# Patient Record
Sex: Female | Born: 1970 | Race: Black or African American | Hispanic: No | Marital: Married | Smoking: Never smoker
Health system: Southern US, Community
[De-identification: ages and names within clinical notes are randomized; demographics above are authoritative.]

## PROBLEM LIST (undated history)

## (undated) DIAGNOSIS — J45909 Unspecified asthma, uncomplicated: Secondary | ICD-10-CM

## (undated) DIAGNOSIS — N289 Disorder of kidney and ureter, unspecified: Secondary | ICD-10-CM

## (undated) DIAGNOSIS — D509 Iron deficiency anemia, unspecified: Secondary | ICD-10-CM

## (undated) DIAGNOSIS — E039 Hypothyroidism, unspecified: Secondary | ICD-10-CM

## (undated) DIAGNOSIS — F32A Depression, unspecified: Secondary | ICD-10-CM

## (undated) DIAGNOSIS — E119 Type 2 diabetes mellitus without complications: Secondary | ICD-10-CM

## (undated) DIAGNOSIS — F329 Major depressive disorder, single episode, unspecified: Secondary | ICD-10-CM

## (undated) DIAGNOSIS — R51 Headache: Secondary | ICD-10-CM

## (undated) HISTORY — DX: Type 2 diabetes mellitus without complications: E11.9

## (undated) HISTORY — PX: OTHER SURGICAL HISTORY: SHX169

## (undated) HISTORY — DX: Major depressive disorder, single episode, unspecified: F32.9

## (undated) HISTORY — DX: Unspecified asthma, uncomplicated: J45.909

## (undated) HISTORY — DX: Iron deficiency anemia, unspecified: D50.9

## (undated) HISTORY — DX: Headache: R51

## (undated) HISTORY — DX: Depression, unspecified: F32.A

## (undated) HISTORY — DX: Hypothyroidism, unspecified: E03.9

---

## 1999-11-23 ENCOUNTER — Encounter: Payer: Self-pay | Admitting: Emergency Medicine

## 1999-11-23 ENCOUNTER — Emergency Department (HOSPITAL_COMMUNITY): Admission: EM | Admit: 1999-11-23 | Discharge: 1999-11-23 | Payer: Self-pay | Admitting: Emergency Medicine

## 2003-06-06 HISTORY — PX: TUBAL LIGATION: SHX77

## 2004-07-05 ENCOUNTER — Emergency Department (HOSPITAL_COMMUNITY): Admission: EM | Admit: 2004-07-05 | Discharge: 2004-07-05 | Payer: Self-pay | Admitting: Emergency Medicine

## 2005-04-21 ENCOUNTER — Ambulatory Visit (HOSPITAL_COMMUNITY): Admission: RE | Admit: 2005-04-21 | Discharge: 2005-04-21 | Payer: Self-pay | Admitting: *Deleted

## 2009-11-07 ENCOUNTER — Emergency Department (HOSPITAL_COMMUNITY): Admission: EM | Admit: 2009-11-07 | Discharge: 2009-11-07 | Payer: Self-pay | Admitting: Emergency Medicine

## 2010-06-25 ENCOUNTER — Encounter: Payer: Self-pay | Admitting: *Deleted

## 2011-06-06 HISTORY — PX: BIOPSY THYROID: PRO38

## 2011-10-18 ENCOUNTER — Other Ambulatory Visit (HOSPITAL_COMMUNITY): Payer: Self-pay | Admitting: Endocrinology

## 2012-05-06 ENCOUNTER — Telehealth (HOSPITAL_COMMUNITY): Payer: Self-pay | Admitting: Dietician

## 2012-05-06 NOTE — Telephone Encounter (Signed)
Phone message reports "the number you have dialed is either no longer available or incorrect". Sent letter to pt home via [place] [employer] in attempt to contact pt to schedule appointment.

## 2012-05-06 NOTE — Telephone Encounter (Signed)
Received referral via fax from [facility] (Dr. Talmage Nap) for dx: obesity.

## 2012-05-13 NOTE — Telephone Encounter (Signed)
Pt did not respond to first contact attempt. Sent letter to pt home via [place] Mail in attempt to contact pt to schedule appointment.  

## 2012-05-16 NOTE — Telephone Encounter (Signed)
Pt has not responded to first two contact attempts. Final attempt. Sent letter to pt home via [place] [employer] in attempt to contact pt to schedule appointment.

## 2012-05-24 NOTE — Telephone Encounter (Signed)
Pt has not responded to attempts to contact to schedule appointment. Referral filed.  

## 2012-09-04 ENCOUNTER — Ambulatory Visit (INDEPENDENT_AMBULATORY_CARE_PROVIDER_SITE_OTHER): Payer: BC Managed Care – PPO | Admitting: Neurology

## 2012-09-04 ENCOUNTER — Encounter: Payer: Self-pay | Admitting: Neurology

## 2012-09-04 VITALS — BP 117/78 | HR 75 | Ht 67.75 in | Wt 210.0 lb

## 2012-09-04 DIAGNOSIS — G43019 Migraine without aura, intractable, without status migrainosus: Secondary | ICD-10-CM | POA: Insufficient documentation

## 2012-09-04 MED ORDER — NORTRIPTYLINE HCL 10 MG PO CAPS: 30.0000 mg | ORAL_CAPSULE | Freq: Every day | ORAL | Status: DC

## 2012-09-04 MED ORDER — KETOROLAC TROMETHAMINE 10 MG PO TABS: 10.0000 mg | ORAL_TABLET | Freq: Three times a day (TID) | ORAL | Status: DC | PRN

## 2012-09-04 NOTE — Patient Instructions (Signed)
  With the Nortriptyline, take as follows: One capsule at night for one week, then take 2 capsules at night for one week, then take three capsules at night    Migraine Headache A migraine headache is an intense, throbbing pain on one or both sides of your head. A migraine can last for 30 minutes to several hours. CAUSES  The exact cause of a migraine headache is not always known. However, a migraine may be caused when nerves in the brain become irritated and release chemicals that cause inflammation. This causes pain. SYMPTOMS  Pain on one or both sides of your head.  Pulsating or throbbing pain.  Severe pain that prevents daily activities.  Pain that is aggravated by any physical activity.  Nausea, vomiting, or both.  Dizziness.  Pain with exposure to bright lights, loud noises, or activity.  General sensitivity to bright lights, loud noises, or smells. Before you get a migraine, you may get warning signs that a migraine is coming (aura). An aura may include:  Seeing flashing lights.  Seeing bright spots, halos, or zig-zag lines.  Having tunnel vision or blurred vision.  Having feelings of numbness or tingling.  Having trouble talking.  Having muscle weakness. MIGRAINE TRIGGERS  Alcohol.  Smoking.  Stress.  Menstruation.  Aged cheeses.  Foods or drinks that contain nitrates, glutamate, aspartame, or tyramine.  Lack of sleep.  Chocolate.  Caffeine.  Hunger.  Physical exertion.  Fatigue.  Medicines used to treat chest pain (nitroglycerine), birth control pills, estrogen, and some blood pressure medicines. DIAGNOSIS  A migraine headache is often diagnosed based on:  Symptoms.  Physical examination.  A CT scan or MRI of your head. TREATMENT Medicines may be given for pain and nausea. Medicines can also be given to help prevent recurrent migraines.  HOME CARE INSTRUCTIONS  Only take over-the-counter or prescription medicines for pain or  discomfort as directed by your caregiver. The use of long-term narcotics is not recommended.  Lie down in a dark, quiet room when you have a migraine.  Keep a journal to find out what may trigger your migraine headaches. For example, write down:  What you eat and drink.  How much sleep you get.  Any change to your diet or medicines.  Limit alcohol consumption.  Quit smoking if you smoke.  Get 7 to 9 hours of sleep, or as recommended by your caregiver.  Limit stress.  Keep lights dim if bright lights bother you and make your migraines worse. SEEK IMMEDIATE MEDICAL CARE IF:   Your migraine becomes severe.  You have a fever.  You have a stiff neck.  You have vision loss.  You have muscular weakness or loss of muscle control.  You start losing your balance or have trouble walking.  You feel faint or pass out.  You have severe symptoms that are different from your first symptoms. MAKE SURE YOU:   Understand these instructions.  Will watch your condition.  Will get help right away if you are not doing well or get worse. Document Released: 05/22/2005 Document Revised: 08/14/2011 Document Reviewed: 05/12/2011 [facility] Patient Information 2013 [place], [place].

## 2012-09-04 NOTE — Progress Notes (Signed)
Reason for visit: Headache  Jessica [name] is a 42 y.o. female  History of present illness:  Jessica [name] is a 42 year old right-handed black female with a history of migraine headaches dating back many years. The patient was seen through this office in the distant past, but she has not been seen in over 4 years. The patient has been on a multitude of medications previously that include Topamax. The patient got minimal benefit with this medication, and she could not tolerate any of the Tryptan medications secondary to nausea and vomiting. The patient indicates that her usual headache frequency is approximately 2 times a week, and her usual migraines begin on the left occipital area of the head, and spread forward. The patient may have blurring of vision, nausea and vomiting, and some tingly sensations in the hands. The patient indicates that odors are an activator for her headache. The patient indicates that 2 months ago, she fell while going downstairs coming out of her house. The patient may have lost consciousness briefly. Since that time, the patient has had daily headaches that are now bifrontal in nature, associated with a throbbing sensation, and some nausea. The headaches are lasting all day long, unassociated with neck stiffness. The patient reports no new numbness or weakness of the face, arms, or legs. The patient feels dizzy at times, and she may be off balance, but she has not had any further falls. The patient denies problems controlling the bowels or the bladder. The patient has been taking Advil for the headache without full benefit. The patient did undergo MRI evaluation of the brain with MRA of the head that was unremarkable. The patient is sent to this office for an evaluation. The patient does report some photophobia and phonophobia with the headache. The patient has no significant scalp tenderness with the headache.  Past Medical History  Diagnosis Date  . Hypothyroidism   . Asthma   .  Headache   . Depression     Past Surgical History  Procedure Laterality Date  . Tubal ligation  2005  . Thyroid biospy    . Biopsy thyroid  2013  . Laroscopy      Family History  Problem Relation Age of Onset  . Hypertension Mother   . Migraines Mother   . Sickle cell anemia Father     Social history:  reports that she has never smoked. She does not have any smokeless tobacco history on file. She reports that she does not drink alcohol or use illicit drugs.  Medications:  No current outpatient prescriptions on file prior to visit.   No current facility-administered medications on file prior to visit.    Allergies:  Allergies  Allergen Reactions  . Hydrocodone   . Imitrex (Sumatriptan) Nausea And Vomiting    Intolerant to all Tryptan medications  . Sulfa Antibiotics     ROS:  Out of a complete 14 system review of symptoms, the patient complains only of the following symptoms, and all other reviewed systems are negative.  Weight gain, fatigue Swelling in the legs Skin rash Blurred vision Shortness of breath, cough, wheezing, history of asthma Allergies Memory loss Headache, dizziness Depression  Blood pressure 117/78, pulse 75, height 5' 7.75" (1.721 m), weight 210 lb (95.255 kg).  Physical Exam  General: The patient is alert and cooperative at the time of the examination.  Head: Pupils are equal, round, and reactive to light. Discs are flat bilaterally.  Neck: The neck is supple, no carotid  bruits are noted.  Respiratory: The respiratory examination is clear.  Cardiovascular: The cardiovascular examination reveals a regular rate and rhythm, no obvious murmurs or rubs are noted.  Neuromuscular: The patient has full range of movement of the cervical spine. There is no crepitus in the temporomandibular joints on either side. No significant tenderness with palpation of the frontal or maxillary sinuses is noted.  Skin: Extremities are without significant  edema.  Neurologic Exam  Mental status:  Cranial nerves: Facial symmetry is present. There is good sensation of the face to pinprick and soft touch bilaterally. The strength of the facial muscles and the muscles to head turning and shoulder shrug are normal bilaterally. Speech is well enunciated, no aphasia or dysarthria is noted. Extraocular movements are full. Visual fields are full.  Motor: The motor testing reveals 5 over 5 strength of all 4 extremities. Good symmetric motor tone is noted throughout.  Sensory: Sensory testing is intact to pinprick, soft touch, vibration sensation, and position sense on all 4 extremities. No evidence of extinction is noted.  Coordination: Cerebellar testing reveals good finger-nose-finger and heel-to-shin bilaterally.  Gait and station: Gait is normal. Tandem gait is normal. Romberg is negative. No drift is seen  Reflexes: Deep tendon reflexes are symmetric and normal bilaterally. Toes are downgoing bilaterally.   Assessment/Plan:  One. Intractable migraine  2. Trauma triggered migraine  The patient appears to have a history of migraine for many years, exacerbated by a fall with a blow to the head. MRI evaluation the brain has been unremarkable. The patient will be placed on low-dose nortriptyline, and she will be given ketorolac if needed for the headache. The patient will followup in 3 months. Usually, the trauma triggered migraine improves after 4-6 months, returning to the usual baseline frequency of headache.  Marlan Palau MD 09/04/2012 8:36 PM  [facility] [address] [place], [place] [address]  Phone [phone] Fax [phone]

## 2013-01-27 ENCOUNTER — Ambulatory Visit: Payer: BC Managed Care – PPO | Admitting: Neurology

## 2013-06-16 ENCOUNTER — Telehealth: Payer: Self-pay | Admitting: *Deleted

## 2013-06-16 NOTE — Telephone Encounter (Signed)
08/14/13 appointment needs to be rescheduled.

## 2013-08-14 ENCOUNTER — Ambulatory Visit: Payer: BC Managed Care – PPO | Admitting: Neurology

## 2013-12-02 ENCOUNTER — Encounter: Payer: Self-pay | Admitting: Neurology

## 2013-12-02 ENCOUNTER — Ambulatory Visit (INDEPENDENT_AMBULATORY_CARE_PROVIDER_SITE_OTHER): Payer: BC Managed Care – PPO | Admitting: Neurology

## 2013-12-02 ENCOUNTER — Encounter (INDEPENDENT_AMBULATORY_CARE_PROVIDER_SITE_OTHER): Payer: Self-pay

## 2013-12-02 VITALS — BP 118/72 | HR 76 | Wt 221.0 lb

## 2013-12-02 DIAGNOSIS — G43019 Migraine without aura, intractable, without status migrainosus: Secondary | ICD-10-CM

## 2013-12-02 DIAGNOSIS — R413 Other amnesia: Secondary | ICD-10-CM

## 2013-12-02 MED ORDER — NORTRIPTYLINE HCL 10 MG PO CAPS: ORAL_CAPSULE | ORAL | Status: DC

## 2013-12-02 MED ORDER — KETOROLAC TROMETHAMINE 10 MG PO TABS
10.0000 mg | ORAL_TABLET | Freq: Three times a day (TID) | ORAL | Status: DC | PRN
Start: 2013-12-02 — End: 2014-04-03

## 2013-12-02 NOTE — Progress Notes (Signed)
Reason for visit: Headache  Jessica [name] is an 43 y.o. female  History of present illness:  Jessica [name] is a 43 year old right-handed black female with a history of migraine headaches. The patient has not been seen since April 2014. She was placed on nortriptyline at that point for daily headaches, and she indicates that the headaches did improve. She was still having some headaches occurring once or twice a week, but the headaches were much easier to treat. The patient is using over-the-counter medications that she indicates that she has side effects from the Tryptan medications with severe nausea. Hydrocodone is not tolerated. In the past, she has taken Midrin without benefit. She ran out of her nortriptyline several months ago, and her headaches have once again worsened. The patient is having one headache every 2 days, and headaches may last up to 2 days. The patient is having some blurring of vision, and some nausea with the headache. She indicates that she is having some problems with concentration and memory as well. She has had MRI evaluation of the brain in the past that was unremarkable. She returns to this office for an evaluation.  Past Medical History  Diagnosis Date  . Hypothyroidism   . Asthma   . Headache(784.0)   . Depression   . Diabetes mellitus without complication   . Iron deficiency anemia     Past Surgical History  Procedure Laterality Date  . Tubal ligation  2005  . Thyroid biospy    . Biopsy thyroid  2013  . Laroscopy      Family History  Problem Relation Age of Onset  . Hypertension Mother   . Migraines Mother   . Sickle cell anemia Father     Social history:  reports that she has never smoked. She does not have any smokeless tobacco history on file. She reports that she does not drink alcohol or use illicit drugs.    Allergies  Allergen Reactions  . Hydrocodone   . Imitrex [Sumatriptan] Nausea And Vomiting    Intolerant to all Tryptan medications    . Sulfa Antibiotics     Medications:  Current Outpatient Prescriptions on File Prior to Visit  Medication Sig Dispense Refill  . Azelastine HCl (ASTEPRO) 0.15 % SOLN Place 1 spray into the nose daily.      . mometasone (ASMANEX) 220 MCG/INH inhaler Inhale 2 puffs into the lungs daily.       No current facility-administered medications on file prior to visit.    ROS:  Out of a complete 14 system review of symptoms, the patient complains only of the following symptoms, and all other reviewed systems are negative.  Light sensitivity, blurred vision Memory loss, headache  Blood pressure 118/72, pulse 76, weight 221 lb (100.245 kg).  Physical Exam  General: The patient is alert and cooperative at the time of the examination.  Skin: No significant peripheral edema is noted.   Neurologic Exam  Mental status: The patient is oriented x 3.  Cranial nerves: Facial symmetry is present. Speech is normal, no aphasia or dysarthria is noted. Extraocular movements are full. Visual fields are full.  Motor: The patient has good strength in all 4 extremities.  Sensory examination: Soft touch sensation is symmetric on the face, arms, and legs.  Coordination: The patient has good finger-nose-finger and heel-to-shin bilaterally.  Gait and station: The patient has a normal gait. Tandem gait is normal. Romberg is negative. No drift is seen.  Reflexes: Deep tendon  reflexes are symmetric.   Assessment/Plan:  1. Migraine headache  2. Reports of memory disturbance  The patient will be sent for some blood work today looking for etiologies of memory problems. This could be related to the migraine headache. She will be placed back on the nortriptyline, as this was helpful previously. She will be given ketorolac for a rescue drug for her headache. She will followup in 3-4 months.  Jill Alexanders MD 12/02/2013 9:39 PM  [facility] [address] [place],  [place] [address]  Phone [phone] Fax [phone]

## 2013-12-02 NOTE — Patient Instructions (Signed)

## 2013-12-04 LAB — RPR: SYPHILIS RPR SCR: NONREACTIVE

## 2013-12-04 LAB — VITAMIN B12: VITAMIN B 12: 300 pg/mL (ref 211–946)

## 2013-12-04 LAB — COPPER, SERUM: Copper: 170 ug/dL — ABNORMAL HIGH (ref 72–166)

## 2014-04-03 ENCOUNTER — Ambulatory Visit (INDEPENDENT_AMBULATORY_CARE_PROVIDER_SITE_OTHER): Payer: BC Managed Care – PPO | Admitting: Adult Health

## 2014-04-03 ENCOUNTER — Encounter: Payer: Self-pay | Admitting: Adult Health

## 2014-04-03 VITALS — BP 113/73 | HR 72 | Ht 68.5 in | Wt 226.0 lb

## 2014-04-03 DIAGNOSIS — G43909 Migraine, unspecified, not intractable, without status migrainosus: Secondary | ICD-10-CM | POA: Insufficient documentation

## 2014-04-03 DIAGNOSIS — G43009 Migraine without aura, not intractable, without status migrainosus: Secondary | ICD-10-CM

## 2014-04-03 MED ORDER — TOPIRAMATE 25 MG PO TABS: ORAL_TABLET | ORAL | Status: DC

## 2014-04-03 MED ORDER — KETOROLAC TROMETHAMINE 10 MG PO TABS: 10.0000 mg | ORAL_TABLET | Freq: Three times a day (TID) | ORAL | Status: DC | PRN

## 2014-04-03 NOTE — Progress Notes (Signed)
I have read the note, and I agree with the clinical assessment and plan.  Lisanne Ponce KEITH   

## 2014-04-03 NOTE — Patient Instructions (Signed)

## 2014-04-03 NOTE — Progress Notes (Signed)
PATIENT: Jessica [name] DOB: 02/27/1971  REASON FOR VISIT: follow up HISTORY FROM: patient  HISTORY OF PRESENT ILLNESS: Jessica [name] is a 43 year old female with a history of migraines. She returns today for followup. She was prescribed nortriptyline but developed a rash 1 month after taking the medication. Her PCP took her off the medication and her rash improved.  She reports that she has 2 headaches a month but she reports that they lasted for 2-3 days. + nausea denies vomiting. + photophobia but denies phonophobia. Afterwards her vision may be blurry but then it resolves. Her headaches are usually located bifrontal or bitemporal but has had the in the occipital area on the left side. She states that her headaches are usually 10/10. She states that cold packs helps ease her pain. Certain smells with trigger her headaches. Patient continues to feel that she is having problems with her memory. Blood work in the past have been unremarkable. She's had a sleep study per the patient and it showed mild sleep apnea.  HISTORY 12/02/13 (CW): Jessica [name] is a 43 year old right-handed black female with a history of migraine headaches. The patient has not been seen since April 2014. She was placed on nortriptyline at that point for daily headaches, and she indicates that the headaches did improve. She was still having some headaches occurring once or twice a week, but the headaches were much easier to treat. The patient is using over-the-counter medications that she indicates that she has side effects from the Tryptan medications with severe nausea. Hydrocodone is not tolerated. In the past, she has taken Midrin without benefit. She ran out of her nortriptyline several months ago, and her headaches have once again worsened. The patient is having one headache every 2 days, and headaches may last up to 2 days. The patient is having some blurring of vision, and some nausea with the headache. She indicates that she is having  some problems with concentration and memory as well. She has had MRI evaluation of the brain in the past that was unremarkable. She returns to this office for an evaluation.    REVIEW OF SYSTEMS: Full 14 system review of systems performed and notable only for:  Constitutional: N/A  Eyes: N/A Ear/Nose/Throat: N/A  Skin: N/A  Cardiovascular: N/A  Respiratory: N/A  Gastrointestinal: N/A  Genitourinary: N/A Hematology/Lymphatic: N/A  Endocrine: N/A Musculoskeletal:N/A  Allergy/Immunology: N/A  Neurological: N/A Psychiatric: N/A Sleep: N/A   ALLERGIES: Allergies  Allergen Reactions  . Hydrocodone   . Imitrex [Sumatriptan] Nausea And Vomiting    Intolerant to all Tryptan medications  . Sulfa Antibiotics     HOME MEDICATIONS: Outpatient Prescriptions Prior to Visit  Medication Sig Dispense Refill  . Azelastine HCl (ASTEPRO) 0.15 % SOLN Place 1 spray into the nose daily.      . Ferrous Sulfate Dried (SLOW RELEASE IRON) 45 MG TBCR Take 1 tablet by mouth daily.      Marland Kitchen ketorolac (TORADOL) 10 MG tablet Take 1 tablet (10 mg total) by mouth every 8 (eight) hours as needed.  30 tablet  1  . levothyroxine (SYNTHROID, LEVOTHROID) 112 MCG tablet Take 112 mcg by mouth daily before breakfast.      . metFORMIN (GLUCOPHAGE) 500 MG tablet Take 500 mg by mouth every evening.      . mometasone (ASMANEX) 220 MCG/INH inhaler Inhale 2 puffs into the lungs daily.      . nortriptyline (PAMELOR) 10 MG capsule Take one capsule at night  for one week, then take 2 capsules at night for one week, then take 3 capsules at night  90 capsule  5   No facility-administered medications prior to visit.    PAST MEDICAL HISTORY: Past Medical History  Diagnosis Date  . Hypothyroidism   . Asthma   . Headache(784.0)   . Depression   . Diabetes mellitus without complication   . Iron deficiency anemia     PAST SURGICAL HISTORY: Past Surgical History  Procedure Laterality Date  . Tubal ligation  2005  .  Thyroid biospy    . Biopsy thyroid  2013  . Laroscopy      FAMILY HISTORY: Family History  Problem Relation Age of Onset  . Hypertension Mother   . Migraines Mother   . Sickle cell anemia Father     SOCIAL HISTORY: History   Social History  . Marital Status: Married    Spouse Name: N/A    Number of Children: 3  . Years of Education: HS   Occupational History  . Not on file.   Social History Main Topics  . Smoking status: Never Smoker   . Smokeless tobacco: Not on file  . Alcohol Use: No  . Drug Use: No  . Sexual Activity: Not on file   Other Topics Concern  . Not on file   Social History Narrative  . No narrative on file      PHYSICAL EXAM  Filed Vitals:   04/03/14 1011  BP: 113/73  Pulse: 72  Height: 5' 8.5" (1.74 m)  Weight: 226 lb (102.513 kg)   Body mass index is 33.86 kg/(m^2).  Generalized: Well developed, in no acute distress  Head: normocephalic and atraumatic. Oropharynx benign  Neck: Supple, no carotid bruits  Cardiac: Regular rate rhythm, no murmur  Musculoskeletal: No deformity   Neurological examination  Mentation: Alert oriented to time, place, history taking. Follows all commands speech and language fluent. MOCA 24/30 Cranial nerve II-XII: Fundoscopic exam reveals sharp disc margins.Pupils were equal round reactive to light extraocular movements were full, visual field were full on confrontational test. Facial sensation and strength were normal. Uvula tongue midline. Head turning and shoulder shrug  were normal and symmetric.Tongue protrusion into cheek strength was normal. Motor: The motor testing reveals 5 over 5 strength of all 4 extremities. Good symmetric motor tone is noted throughout.  Sensory: Sensory testing is intact to pinprick, soft touch, vibration sensation, and position sense on all 4 extremities. No evidence of extinction is noted.  Coordination: Cerebellar testing reveals good finger-nose-finger and heel-to-shin  bilaterally.  Gait and station: Gait is normal. Tandem gait is normal. Romberg is negative. No drift is seen.  Reflexes: Deep tendon reflexes are symmetric and normal bilaterally. Toes are downgoing bilaterally.   DIAGNOSTIC DATA (LABS, IMAGING, TESTING) - I reviewed patient records, labs, notes, testing and imaging myself where available.      Lab Results  Component Value Date   VITAMINB12 300 12/02/2013   No results found for this basename: TSH      ASSESSMENT AND PLAN 43 y.o. year old female  has a past medical history of Hypothyroidism; Asthma; Headache(784.0); Depression; Diabetes mellitus without complication; and Iron deficiency anemia. here with;  1. Migraines  The patient was unable to tolerate the nortriptyline due to a rash. She has stopped this medication. I will start the patient on Topamax. She will take 25 mg at bedtime for 1 week then increase to 50 mg at bedtime thereafter. Patient continues  to feel that her memory is affected. Her MOCA was 24/30. We will continue to monitor her memory over time. If the patient's symptoms worsen or she develops new symptoms she should let us know. Otherwise she should followup in 4 months or sooner if needed.   Ward Givens, MSN, NP-C 04/03/2014, 10:09 AM [facility] [address] [place], [place] [address] [phone]  Note: This document was prepared with digital dictation and possible smart phrase technology. Any transcriptional errors that result from this process are unintentional.

## 2014-08-03 ENCOUNTER — Ambulatory Visit: Payer: BC Managed Care – PPO | Admitting: Adult Health

## 2014-08-05 ENCOUNTER — Encounter: Payer: Self-pay | Admitting: Adult Health

## 2015-12-30 ENCOUNTER — Encounter (HOSPITAL_COMMUNITY): Payer: Self-pay

## 2015-12-30 ENCOUNTER — Emergency Department (HOSPITAL_COMMUNITY)
Admission: EM | Admit: 2015-12-30 | Discharge: 2015-12-30 | Disposition: A | Discharge status: Home / Self Care | Payer: Self-pay | Attending: Emergency Medicine | Admitting: Emergency Medicine

## 2015-12-30 DIAGNOSIS — F329 Major depressive disorder, single episode, unspecified: Secondary | ICD-10-CM | POA: Insufficient documentation

## 2015-12-30 DIAGNOSIS — Z79899 Other long term (current) drug therapy: Secondary | ICD-10-CM | POA: Insufficient documentation

## 2015-12-30 DIAGNOSIS — T63411A Toxic effect of venom of centipedes and venomous millipedes, accidental (unintentional), initial encounter: Secondary | ICD-10-CM | POA: Insufficient documentation

## 2015-12-30 DIAGNOSIS — E039 Hypothyroidism, unspecified: Secondary | ICD-10-CM | POA: Insufficient documentation

## 2015-12-30 DIAGNOSIS — E119 Type 2 diabetes mellitus without complications: Secondary | ICD-10-CM | POA: Insufficient documentation

## 2015-12-30 DIAGNOSIS — T63443A Toxic effect of venom of bees, assault, initial encounter: Secondary | ICD-10-CM

## 2015-12-30 LAB — I-STAT CHEM 8, ED
BUN: 12 mg/dL (ref 6–20)
CALCIUM ION: 1.21 mmol/L (ref 1.13–1.30)
Chloride: 103 mmol/L (ref 101–111)
Creatinine, Ser: 1.2 mg/dL — ABNORMAL HIGH (ref 0.44–1.00)
Glucose, Bld: 96 mg/dL (ref 65–99)
HEMATOCRIT: 37 % (ref 36.0–46.0)
HEMOGLOBIN: 12.6 g/dL (ref 12.0–15.0)
Potassium: 3.8 mmol/L (ref 3.5–5.1)
SODIUM: 140 mmol/L (ref 135–145)
TCO2: 27 mmol/L (ref 0–100)

## 2015-12-30 MED ORDER — PREDNISONE 20 MG PO TABS: ORAL_TABLET | ORAL | 0 refills | Status: DC

## 2015-12-30 MED ORDER — DIPHENHYDRAMINE HCL 50 MG/ML IJ SOLN
25.0000 mg | Freq: Once | INTRAMUSCULAR | Status: AC
  Administered 2015-12-30: 25 mg via INTRAVENOUS
  Filled 2015-12-30: qty 1

## 2015-12-30 MED ORDER — FAMOTIDINE IN NACL 20-0.9 MG/50ML-% IV SOLN
20.0000 mg | Freq: Once | INTRAVENOUS | Status: AC
  Administered 2015-12-30: 20 mg via INTRAVENOUS
  Filled 2015-12-30: qty 50

## 2015-12-30 MED ORDER — EPINEPHRINE 0.3 MG/0.3ML IJ SOAJ
0.3000 mg | Freq: Once | INTRAMUSCULAR | 1 refills | Status: AC
Start: 2015-12-30 — End: 2015-12-30

## 2015-12-30 MED ORDER — EPINEPHRINE 0.3 MG/0.3ML IJ SOAJ
0.3000 mg | Freq: Once | INTRAMUSCULAR | Status: AC
  Administered 2015-12-30: 0.3 mg via INTRAMUSCULAR
  Filled 2015-12-30: qty 0.3

## 2015-12-30 MED ORDER — SODIUM CHLORIDE 0.9 % IV BOLUS (SEPSIS)
1000.0000 mL | Freq: Once | INTRAVENOUS | Status: AC
  Administered 2015-12-30: 1000 mL via INTRAVENOUS
  Filled 2015-12-30: qty 1000

## 2015-12-30 MED ORDER — METHYLPREDNISOLONE SODIUM SUCC 125 MG IJ SOLR
125.0000 mg | Freq: Once | INTRAMUSCULAR | Status: AC
  Administered 2015-12-30: 125 mg via INTRAVENOUS
  Filled 2015-12-30: qty 2

## 2015-12-30 MED ORDER — FAMOTIDINE 20 MG PO TABS: 20.0000 mg | ORAL_TABLET | Freq: Two times a day (BID) | ORAL | 0 refills | Status: DC

## 2015-12-30 NOTE — ED Notes (Signed)
Gave patient ice pack as requested to place on lower lip.

## 2015-12-30 NOTE — Discharge Instructions (Signed)
Take Benadryl 25 mg every 4-6 hours for swelling or itching. Follow-up with her family doctor next week if not improving. Return if getting worse

## 2015-12-30 NOTE — ED Triage Notes (Signed)
Patient was stung by bee on lower lip. Has lowe lip swelling and states her throat feels tight. Reports feeling a little short of breath.

## 2015-12-30 NOTE — ED Notes (Signed)
Dr. Roderic Palau made aware of patient.

## 2015-12-30 NOTE — ED Notes (Signed)
Dr Zammit at bedside. 
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
## 2016-05-12 ENCOUNTER — Emergency Department (HOSPITAL_COMMUNITY)
Admission: EM | Admit: 2016-05-12 | Discharge: 2016-05-12 | Disposition: A | Discharge status: Home / Self Care | Payer: Self-pay | Attending: Emergency Medicine | Admitting: Emergency Medicine

## 2016-05-12 ENCOUNTER — Encounter (HOSPITAL_COMMUNITY): Payer: Self-pay

## 2016-05-12 DIAGNOSIS — R0602 Shortness of breath: Secondary | ICD-10-CM | POA: Insufficient documentation

## 2016-05-12 DIAGNOSIS — E039 Hypothyroidism, unspecified: Secondary | ICD-10-CM | POA: Insufficient documentation

## 2016-05-12 DIAGNOSIS — E119 Type 2 diabetes mellitus without complications: Secondary | ICD-10-CM | POA: Insufficient documentation

## 2016-05-12 DIAGNOSIS — Z79899 Other long term (current) drug therapy: Secondary | ICD-10-CM | POA: Insufficient documentation

## 2016-05-12 DIAGNOSIS — J705 Respiratory conditions due to smoke inhalation: Secondary | ICD-10-CM | POA: Insufficient documentation

## 2016-05-12 DIAGNOSIS — R51 Headache: Secondary | ICD-10-CM | POA: Insufficient documentation

## 2016-05-12 DIAGNOSIS — Y92009 Unspecified place in unspecified non-institutional (private) residence as the place of occurrence of the external cause: Secondary | ICD-10-CM | POA: Insufficient documentation

## 2016-05-12 DIAGNOSIS — Y999 Unspecified external cause status: Secondary | ICD-10-CM | POA: Insufficient documentation

## 2016-05-12 DIAGNOSIS — Y939 Activity, unspecified: Secondary | ICD-10-CM | POA: Insufficient documentation

## 2016-05-12 DIAGNOSIS — X088XXA Exposure to other specified smoke, fire and flames, initial encounter: Secondary | ICD-10-CM | POA: Insufficient documentation

## 2016-05-12 DIAGNOSIS — T59811A Toxic effect of smoke, accidental (unintentional), initial encounter: Secondary | ICD-10-CM

## 2016-05-12 DIAGNOSIS — J45909 Unspecified asthma, uncomplicated: Secondary | ICD-10-CM | POA: Insufficient documentation

## 2016-05-12 MED ORDER — ALBUTEROL SULFATE (2.5 MG/3ML) 0.083% IN NEBU
5.0000 mg | INHALATION_SOLUTION | Freq: Once | RESPIRATORY_TRACT | Status: AC
  Administered 2016-05-12: 5 mg via RESPIRATORY_TRACT
  Filled 2016-05-12: qty 6

## 2016-05-12 MED ORDER — ACETAMINOPHEN 325 MG PO TABS
650.0000 mg | ORAL_TABLET | Freq: Once | ORAL | Status: AC
  Administered 2016-05-12: 650 mg via ORAL
  Filled 2016-05-12: qty 2
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
## 2016-05-12 NOTE — Progress Notes (Signed)
Unable to place lab values of COOX in [place]. RN made aware and notified of this and the results. The results are as follows   ctHb- 12.0  S02-95.7  FO2HB-93.9  FCOHb- 1.2  FMetHb-0.8  ctO2C- 16.0

## 2017-09-01 LAB — GLUCOSE, POCT (MANUAL RESULT ENTRY): POC Glucose: 120 mg/dl — AB (ref 70–99)

## 2017-09-01 LAB — POCT GLYCOSYLATED HEMOGLOBIN (HGB A1C)

## 2018-03-11 ENCOUNTER — Other Ambulatory Visit (HOSPITAL_COMMUNITY): Payer: Self-pay | Admitting: Nephrology

## 2018-03-11 DIAGNOSIS — N183 Chronic kidney disease, stage 3 unspecified: Secondary | ICD-10-CM

## 2018-03-15 ENCOUNTER — Ambulatory Visit (HOSPITAL_COMMUNITY)
Admission: RE | Admit: 2018-03-15 | Discharge: 2018-03-15 | Disposition: A | Discharge status: Home / Self Care | Payer: BLUE CROSS/BLUE SHIELD | Source: Ambulatory Visit | Attending: Nephrology | Admitting: Nephrology

## 2018-03-15 DIAGNOSIS — N183 Chronic kidney disease, stage 3 unspecified: Secondary | ICD-10-CM

## 2018-08-01 ENCOUNTER — Ambulatory Visit (INDEPENDENT_AMBULATORY_CARE_PROVIDER_SITE_OTHER): Payer: BLUE CROSS/BLUE SHIELD | Admitting: Otolaryngology

## 2018-08-01 DIAGNOSIS — J343 Hypertrophy of nasal turbinates: Secondary | ICD-10-CM

## 2018-08-01 DIAGNOSIS — J310 Chronic rhinitis: Secondary | ICD-10-CM

## 2018-08-01 DIAGNOSIS — R430 Anosmia: Secondary | ICD-10-CM

## 2018-08-04 ENCOUNTER — Other Ambulatory Visit: Payer: Self-pay

## 2018-08-04 ENCOUNTER — Emergency Department (HOSPITAL_COMMUNITY)
Admission: EM | Admit: 2018-08-04 | Discharge: 2018-08-04 | Disposition: A | Discharge status: Home / Self Care | Payer: BLUE CROSS/BLUE SHIELD | Attending: Emergency Medicine | Admitting: Emergency Medicine

## 2018-08-04 ENCOUNTER — Encounter (HOSPITAL_COMMUNITY): Payer: Self-pay | Admitting: Emergency Medicine

## 2018-08-04 ENCOUNTER — Emergency Department (HOSPITAL_COMMUNITY): Payer: BLUE CROSS/BLUE SHIELD

## 2018-08-04 DIAGNOSIS — E119 Type 2 diabetes mellitus without complications: Secondary | ICD-10-CM | POA: Insufficient documentation

## 2018-08-04 DIAGNOSIS — M7918 Myalgia, other site: Secondary | ICD-10-CM | POA: Diagnosis present

## 2018-08-04 DIAGNOSIS — Z79899 Other long term (current) drug therapy: Secondary | ICD-10-CM | POA: Diagnosis not present

## 2018-08-04 DIAGNOSIS — R05 Cough: Secondary | ICD-10-CM | POA: Insufficient documentation

## 2018-08-04 DIAGNOSIS — J111 Influenza due to unidentified influenza virus with other respiratory manifestations: Secondary | ICD-10-CM | POA: Diagnosis not present

## 2018-08-04 DIAGNOSIS — J45909 Unspecified asthma, uncomplicated: Secondary | ICD-10-CM | POA: Insufficient documentation

## 2018-08-04 DIAGNOSIS — E039 Hypothyroidism, unspecified: Secondary | ICD-10-CM | POA: Insufficient documentation

## 2018-08-04 HISTORY — DX: Disorder of kidney and ureter, unspecified: N28.9

## 2018-08-04 LAB — INFLUENZA PANEL BY PCR (TYPE A & B)
INFLBPCR: NEGATIVE
Influenza A By PCR: POSITIVE — AB

## 2018-08-04 MED ORDER — OSELTAMIVIR PHOSPHATE 75 MG PO CAPS: 75.0000 mg | ORAL_CAPSULE | Freq: Two times a day (BID) | ORAL | 0 refills | Status: DC

## 2018-08-04 MED ORDER — ONDANSETRON 4 MG PO TBDP
ORAL_TABLET | ORAL | Status: AC
  Filled 2018-08-04: qty 1

## 2018-08-04 MED ORDER — ACETAMINOPHEN 500 MG PO TABS
ORAL_TABLET | ORAL | Status: AC
  Filled 2018-08-04: qty 2

## 2018-08-04 MED ORDER — BENZONATATE 100 MG PO CAPS: 200.0000 mg | ORAL_CAPSULE | Freq: Three times a day (TID) | ORAL | 0 refills | Status: DC | PRN

## 2018-08-04 MED ORDER — OSELTAMIVIR PHOSPHATE 75 MG PO CAPS
75.0000 mg | ORAL_CAPSULE | Freq: Once | ORAL | Status: AC
  Administered 2018-08-04: 75 mg via ORAL
  Filled 2018-08-04: qty 1

## 2018-08-04 MED ORDER — ACETAMINOPHEN 500 MG PO TABS
1000.0000 mg | ORAL_TABLET | Freq: Once | ORAL | Status: AC
  Administered 2018-08-04: 1000 mg via ORAL

## 2018-08-04 MED ORDER — BENZONATATE 100 MG PO CAPS
200.0000 mg | ORAL_CAPSULE | Freq: Once | ORAL | Status: AC
  Administered 2018-08-04: 200 mg via ORAL
  Filled 2018-08-04: qty 2

## 2018-08-04 MED ORDER — ONDANSETRON 4 MG PO TBDP
4.0000 mg | ORAL_TABLET | Freq: Once | ORAL | Status: AC
  Administered 2018-08-04: 4 mg via ORAL

## 2018-08-04 NOTE — Discharge Instructions (Addendum)
Rest,  Drink plenty of fluids.  Take motrin or tylenol for achiness and fever reduction.  You may take the tamiflu if you desire.  This medicine may improve your flu symptoms 1-2 days sooner.  Get rechecked for increased shortness of breath,  Increased fever or increasing weakness.  

## 2018-08-04 NOTE — ED Triage Notes (Signed)
Pt c/o of nausea, cough, body aches, chill and fever

## 2018-08-05 NOTE — ED Provider Notes (Signed)
[facility] EMERGENCY DEPARTMENT Provider Note   CSN: [id number] Arrival date & time: 08/04/18  1726    History   Chief Complaint Chief Complaint  Patient presents with  . Influenza    HPI Jessica [name] is a 48 y.o. female with a history of asthma, renal insufficiency and diet controlled DM presenting with a  1 day history of flu like symptoms, reporting development of generalized body aches, dry cough with chills and subjective fever which started last night.  She reports increased fatigue and has clear nasal drainage without congestion, denies sore throat, headache, neck pain or stiffness and also denies shortness of breath or wheezing, also no cp, abdominal pain,vomiting or diarrhea but does endorse nausea with decreased po intake today.  She has had tylenol for fever reduction, last dose this am.    The history is provided by the patient.    Past Medical History:  Diagnosis Date  . Asthma   . Depression   . Diabetes mellitus without complication ([place])   . Headache(784.0)   . Hypothyroidism   . Iron deficiency anemia   . Renal disorder     Patient Active Problem List   Diagnosis Date Noted  . Migraine 04/03/2014  . Intractable migraine without aura 09/04/2012    Past Surgical History:  Procedure Laterality Date  . BIOPSY THYROID  2013  . laroscopy    . thyroid biospy    . TUBAL LIGATION  2005     OB History   No obstetric history on file.      Home Medications    Prior to Admission medications   Medication Sig Start Date End Date Taking? Authorizing Provider  benzonatate (TESSALON) 100 MG capsule Take 2 capsules (200 mg total) by mouth 3 (three) times daily as needed. 08/04/18   Evalee Jefferson, PA-C  famotidine (PEPCID) 20 MG tablet Take 1 tablet (20 mg total) by mouth 2 (two) times daily. 12/30/15   Milton Ferguson, MD  ketorolac (TORADOL) 10 MG tablet Take 1 tablet (10 mg total) by mouth every 8 (eight) hours as needed. Patient not taking: Reported on 12/30/2015  04/03/14   Ward Givens, NP  levothyroxine (SYNTHROID, LEVOTHROID) 112 MCG tablet Take 112 mcg by mouth daily before breakfast.    [provider]  oseltamivir (TAMIFLU) 75 MG capsule Take 1 capsule (75 mg total) by mouth every 12 (twelve) hours. 08/04/18   Evalee Jefferson, PA-C  predniSONE (DELTASONE) 20 MG tablet 2 tabs po daily x 3 days 12/30/15   Milton Ferguson, MD  topiramate (TOPAMAX) 25 MG tablet Take one tablet PO at HS for 1 week then increase to 2 tablets PO at HS thereafter. Patient not taking: Reported on 12/30/2015 04/03/14   Ward Givens, NP    Family History Family History  Problem Relation Age of Onset  . Hypertension Mother   . Migraines Mother   . Sickle cell anemia Father     Social History Social History   Tobacco Use  . Smoking status: Never Smoker  . Smokeless tobacco: Never Used  Substance Use Topics  . Alcohol use: No  . Drug use: No     Allergies   Bee venom; Hydrocodone; Imitrex [sumatriptan]; and Sulfa antibiotics   Review of Systems Review of Systems  Constitutional: Positive for chills and fever.  HENT: Positive for rhinorrhea. Negative for congestion and sore throat.   Eyes: Negative.   Respiratory: Positive for cough. Negative for chest tightness, shortness of breath and wheezing.  Cardiovascular: Negative for chest pain.  Gastrointestinal: Positive for nausea. Negative for abdominal pain, diarrhea and vomiting.  Genitourinary: Negative.  Negative for dysuria.  Musculoskeletal: Positive for myalgias. Negative for arthralgias, joint swelling and neck pain.  Skin: Negative.  Negative for rash and wound.  Neurological: Negative for dizziness, weakness, light-headedness, numbness and headaches.  Psychiatric/Behavioral: Negative.      Physical Exam Updated Vital Signs BP 121/76 (BP Location: Right Arm)   Pulse 90   Temp 97.9 F (36.6 C) (Oral)   Resp 18   Ht 5\' 7"  (1.702 m)   Wt 107 kg   SpO2 95%   BMI 36.96 kg/m    Physical Exam Constitutional:      Appearance: She is well-developed.  HENT:     Head: Normocephalic and atraumatic.     Right Ear: Tympanic membrane and ear canal normal.     Left Ear: Tympanic membrane and ear canal normal.     Nose: Rhinorrhea present. No mucosal edema.     Mouth/Throat:     Mouth: Mucous membranes are moist.     Pharynx: Oropharynx is clear. Uvula midline. No oropharyngeal exudate or posterior oropharyngeal erythema.     Tonsils: No tonsillar exudate or tonsillar abscesses.  Eyes:     Conjunctiva/sclera: Conjunctivae normal.  Neck:     Musculoskeletal: Full passive range of motion without pain.  Cardiovascular:     Rate and Rhythm: Normal rate.     Pulses: Normal pulses.     Heart sounds: Normal heart sounds.  Pulmonary:     Effort: Pulmonary effort is normal. No accessory muscle usage, respiratory distress or retractions.     Breath sounds: Examination of the right-lower field reveals rhonchi. Rhonchi present. No wheezing or rales.     Comments: Rhonchi right base, clears with cough. Abdominal:     Palpations: Abdomen is soft.     Tenderness: There is no abdominal tenderness.  Musculoskeletal: Normal range of motion.  Skin:    General: Skin is warm and dry.     Findings: No rash.  Neurological:     Mental Status: She is alert and oriented to person, place, and time.      ED Treatments / Results  Labs (all labs ordered are listed, but only abnormal results are displayed) Labs Reviewed  INFLUENZA PANEL BY PCR (TYPE A & B) - Abnormal; Notable for the following components:      Result Value   Influenza A By PCR POSITIVE (*)    All other components within normal limits    EKG None  Radiology Dg Chest 2 View  Result Date: 08/04/2018 CLINICAL DATA:  48 y/o  F; nausea, cough, body aches, chills, fever. EXAM: CHEST - 2 VIEW COMPARISON:  01/07/2018 chest radiograph FINDINGS: Stable heart size and mediastinal contours are within normal limits. Both  lungs are clear. The visualized skeletal structures are unremarkable. IMPRESSION: No acute pulmonary process identified. Electronically Signed   By: Kristine Garbe M.D.   On: 08/04/2018 20:22    Procedures Procedures (including critical care time)  Medications Ordered in ED Medications  acetaminophen (TYLENOL) tablet 1,000 mg (1,000 mg Oral Given 08/04/18 1802)  ondansetron (ZOFRAN-ODT) disintegrating tablet 4 mg (4 mg Oral Given 08/04/18 1802)  oseltamivir (TAMIFLU) capsule 75 mg (75 mg Oral Given 08/04/18 1948)  benzonatate (TESSALON) capsule 200 mg (200 mg Oral Given 08/04/18 2102)     Initial Impression / Assessment and Plan / ED Course  I have reviewed the triage  vital signs and the nursing notes.  Pertinent labs & imaging results that were available during my care of the patient were reviewed by me and considered in my medical decision making (see chart for details).        Pt with acute influenza, no respiratory distress, fever reduced with tylenol. VS stable.  Pt advised rest, fever tx, increased fluids, tamiflu started.  Strict return precautions outlined.   The patient appears reasonably screened and/or stabilized for discharge and I doubt any other medical condition or other [facility] requiring further screening, evaluation, or treatment in the ED at this time prior to discharge.   Final Clinical Impressions(s) / ED Diagnoses   Final diagnoses:  Influenza    ED Discharge Orders         Ordered    oseltamivir (TAMIFLU) 75 MG capsule  Every 12 hours,   Status:  Discontinued     08/04/18 2057    benzonatate (TESSALON) 100 MG capsule  3 times daily PRN,   Status:  Discontinued     08/04/18 2057    benzonatate (TESSALON) 100 MG capsule  3 times daily PRN     08/04/18 2107    oseltamivir (TAMIFLU) 75 MG capsule  Every 12 hours     08/04/18 2107           Evalee Jefferson, PA-C 08/06/18 2023    Dorie Rank, MD 08/08/18 [phone]

## 2018-08-22 ENCOUNTER — Ambulatory Visit (INDEPENDENT_AMBULATORY_CARE_PROVIDER_SITE_OTHER): Payer: BLUE CROSS/BLUE SHIELD | Admitting: Otolaryngology

## 2019-08-07 ENCOUNTER — Emergency Department (HOSPITAL_COMMUNITY)
Admission: EM | Admit: 2019-08-07 | Discharge: 2019-08-07 | Disposition: A | Discharge status: Home / Self Care | Payer: Managed Care, Other (non HMO) | Attending: Emergency Medicine | Admitting: Emergency Medicine

## 2019-08-07 ENCOUNTER — Encounter (HOSPITAL_COMMUNITY): Payer: Self-pay

## 2019-08-07 DIAGNOSIS — E119 Type 2 diabetes mellitus without complications: Secondary | ICD-10-CM | POA: Insufficient documentation

## 2019-08-07 DIAGNOSIS — J45909 Unspecified asthma, uncomplicated: Secondary | ICD-10-CM | POA: Diagnosis not present

## 2019-08-07 DIAGNOSIS — E039 Hypothyroidism, unspecified: Secondary | ICD-10-CM | POA: Insufficient documentation

## 2019-08-07 DIAGNOSIS — Z79899 Other long term (current) drug therapy: Secondary | ICD-10-CM | POA: Insufficient documentation

## 2019-08-07 DIAGNOSIS — R0602 Shortness of breath: Secondary | ICD-10-CM | POA: Insufficient documentation

## 2019-08-07 DIAGNOSIS — T65891A Toxic effect of other specified substances, accidental (unintentional), initial encounter: Secondary | ICD-10-CM | POA: Diagnosis not present

## 2019-08-07 MED ORDER — ALBUTEROL SULFATE HFA 108 (90 BASE) MCG/ACT IN AERS
6.0000 | INHALATION_SPRAY | Freq: Once | RESPIRATORY_TRACT | Status: AC
  Administered 2019-08-07: 6 via RESPIRATORY_TRACT
  Filled 2019-08-07: qty 6.7

## 2019-08-07 MED ORDER — ALBUTEROL SULFATE (2.5 MG/3ML) 0.083% IN NEBU: 2.5000 mg | INHALATION_SOLUTION | Freq: Four times a day (QID) | RESPIRATORY_TRACT | 12 refills | Status: DC | PRN

## 2019-08-07 MED ORDER — ALBUTEROL SULFATE HFA 108 (90 BASE) MCG/ACT IN AERS: 1.0000 | INHALATION_SPRAY | Freq: Four times a day (QID) | RESPIRATORY_TRACT | 2 refills | Status: DC | PRN
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
## 2019-08-07 NOTE — Discharge Instructions (Addendum)
I sent prescriptions for your nebulizer medicine and a new inhaler to your pharmacy.  Please use the spacer any time you're using the inhaler.

## 2019-08-07 NOTE — ED Notes (Signed)
Patient denies pain and is resting comfortably.  

## 2020-04-01 ENCOUNTER — Other Ambulatory Visit: Payer: Managed Care, Other (non HMO)

## 2020-04-01 ENCOUNTER — Other Ambulatory Visit: Payer: Self-pay | Admitting: *Deleted

## 2020-04-01 DIAGNOSIS — Z20822 Contact with and (suspected) exposure to covid-19: Secondary | ICD-10-CM

## 2020-04-02 LAB — SPECIMEN STATUS REPORT

## 2020-04-02 LAB — SARS-COV-2, NAA 2 DAY TAT

## 2020-04-02 LAB — NOVEL CORONAVIRUS, NAA: SARS-CoV-2, NAA: NOT DETECTED

## 2020-12-23 ENCOUNTER — Other Ambulatory Visit (HOSPITAL_COMMUNITY): Payer: Self-pay | Admitting: Nephrology

## 2020-12-23 DIAGNOSIS — C220 Liver cell carcinoma: Secondary | ICD-10-CM

## 2020-12-27 ENCOUNTER — Other Ambulatory Visit (HOSPITAL_COMMUNITY): Payer: Managed Care, Other (non HMO)

## 2021-01-06 ENCOUNTER — Ambulatory Visit (HOSPITAL_COMMUNITY): Admission: RE | Admit: 2021-01-06 | Payer: Managed Care, Other (non HMO) | Source: Ambulatory Visit

## 2021-01-14 ENCOUNTER — Other Ambulatory Visit (HOSPITAL_BASED_OUTPATIENT_CLINIC_OR_DEPARTMENT_OTHER): Payer: Self-pay

## 2021-01-14 DIAGNOSIS — G478 Other sleep disorders: Secondary | ICD-10-CM

## 2021-01-26 ENCOUNTER — Ambulatory Visit: Payer: Managed Care, Other (non HMO) | Attending: Nephrology | Admitting: Neurology

## 2021-01-26 ENCOUNTER — Other Ambulatory Visit: Payer: Self-pay

## 2021-01-26 DIAGNOSIS — G478 Other sleep disorders: Secondary | ICD-10-CM | POA: Diagnosis not present

## 2021-02-07 NOTE — Procedures (Signed)
     [place] A. Merlene Laughter, MD     [email or web address]             HOME SLEEP STUDY  LOCATION: [place]  Patient Name: Jessica [name], Jessica [name] Date: 01/26/2021 Gender: Female D.O.B: 1970-07-21 Age (years): 28 Referring Provider: Manpreet Bhutani Height (inches): 33 Interpreting Physician: Phillips Odor MD, ABSM Weight (lbs): 254 RPSGT: Peak, Robert BMI: 40 MRN: [id number] Neck Size: CLINICAL INFORMATION Sleep Study Type: HST     Indication for sleep study: N/A     Epworth Sleepiness Score: N/A  SLEEP STUDY TECHNIQUE A multi-channel overnight portable sleep study was performed. The channels recorded were: nasal airflow, thoracic respiratory movement, and oxygen saturation with a pulse oximetry. Snoring was also monitored.  MEDICATIONS Patient self administered medications include: N/A.  Current Outpatient Medications:    albuterol (PROVENTIL) (2.5 MG/3ML) 0.083% nebulizer solution, Take 3 mLs (2.5 mg total) by nebulization every 6 (six) hours as needed for wheezing or shortness of breath., Disp: 75 mL, Rfl: 12   albuterol (VENTOLIN HFA) 108 (90 Base) MCG/ACT inhaler, Inhale 1-2 puffs into the lungs every 6 (six) hours as needed for wheezing or shortness of breath., Disp: 8 g, Rfl: 2   benzonatate (TESSALON) 100 MG capsule, Take 2 capsules (200 mg total) by mouth 3 (three) times daily as needed., Disp: 30 capsule, Rfl: 0   famotidine (PEPCID) 20 MG tablet, Take 1 tablet (20 mg total) by mouth 2 (two) times daily., Disp: 10 tablet, Rfl: 0   ketorolac (TORADOL) 10 MG tablet, Take 1 tablet (10 mg total) by mouth every 8 (eight) hours as needed. (Patient not taking: Reported on 12/30/2015), Disp: 30 tablet, Rfl: 1   levothyroxine (SYNTHROID, LEVOTHROID) 112 MCG tablet, Take 112 mcg by mouth daily before breakfast., Disp: , Rfl:    oseltamivir (TAMIFLU) 75 MG capsule, Take 1 capsule (75 mg total) by mouth every 12 (twelve) hours., Disp: 10 capsule, Rfl: 0    predniSONE (DELTASONE) 20 MG tablet, 2 tabs po daily x 3 days, Disp: 6 tablet, Rfl: 0   topiramate (TOPAMAX) 25 MG tablet, Take one tablet PO at HS for 1 week then increase to 2 tablets PO at HS thereafter. (Patient not taking: Reported on 12/30/2015), Disp: 60 tablet, Rfl: 3   SLEEP ARCHITECTURE Patient was studied for 477.5 minutes. The sleep efficiency was 37.8 % and the patient was supine for 58.2%. The arousal index was 0.0 per hour.  RESPIRATORY PARAMETERS The overall AHI was 22.2 per hour, with a central apnea index of 1.6 per hour.  The oxygen nadir was 83% during sleep.     CARDIAC DATA Mean heart rate during sleep was 72.0 bpm.  IMPRESSIONS Moderate obstructive sleep apnea is documented with this study. Auto PAP 8-14 is recommended.   Delano Metz, MD Diplomate, American Board of Sleep Medicine.  ELECTRONICALLY SIGNED ON:  02/07/2021, 5:56 PM [place] PH: [phone]   FX: [phone] [place]

## 2021-03-24 ENCOUNTER — Other Ambulatory Visit (HOSPITAL_COMMUNITY): Payer: Managed Care, Other (non HMO)

## 2021-03-28 ENCOUNTER — Ambulatory Visit (HOSPITAL_COMMUNITY)
Admission: RE | Admit: 2021-03-28 | Discharge: 2021-03-28 | Disposition: A | Discharge status: Home / Self Care | Payer: Managed Care, Other (non HMO) | Source: Ambulatory Visit | Attending: Nephrology | Admitting: Nephrology

## 2021-03-28 ENCOUNTER — Other Ambulatory Visit: Payer: Self-pay

## 2021-03-28 DIAGNOSIS — I340 Nonrheumatic mitral (valve) insufficiency: Secondary | ICD-10-CM

## 2021-03-28 DIAGNOSIS — C220 Liver cell carcinoma: Secondary | ICD-10-CM | POA: Insufficient documentation

## 2021-03-28 DIAGNOSIS — Z0189 Encounter for other specified special examinations: Secondary | ICD-10-CM

## 2021-03-28 LAB — ECHOCARDIOGRAM COMPLETE
Area-P 1/2: 5.27 cm2
S' Lateral: 2.3 cm

## 2021-03-28 NOTE — Progress Notes (Signed)
*  PRELIMINARY RESULTS* Echocardiogram 2D Echocardiogram has been performed.  Jessica [name] 03/28/2021, 11:14 AM

## 2021-11-21 ENCOUNTER — Ambulatory Visit (HOSPITAL_BASED_OUTPATIENT_CLINIC_OR_DEPARTMENT_OTHER): Payer: No Typology Code available for payment source | Admitting: Orthopaedic Surgery

## 2021-11-21 ENCOUNTER — Ambulatory Visit (INDEPENDENT_AMBULATORY_CARE_PROVIDER_SITE_OTHER): Payer: No Typology Code available for payment source

## 2021-11-21 DIAGNOSIS — M2142 Flat foot [pes planus] (acquired), left foot: Secondary | ICD-10-CM

## 2021-11-21 DIAGNOSIS — M25572 Pain in left ankle and joints of left foot: Secondary | ICD-10-CM | POA: Diagnosis not present

## 2021-11-21 NOTE — Progress Notes (Signed)
Chief Complaint: Flatfoot deformity     History of Present Illness:    Jessica [name] is a 51 y.o. female presents today with ongoing left flatfoot deformity which has been significantly painful over the plantar aspect of the arch medially.  She states that she has had flatfoot ever since she was a child but this is been worse recently.  She works at [employer] improvement in [place] is on her feet most of the day.  She has trialed more comfortable shoes as well as orthotics with very minimal relief.  At this time she is quite frustrated about the pain she is experiencing.    Surgical History:   None  PMH/PSH/Family History/Social History/Meds/Allergies:    Past Medical History:  Diagnosis Date   Asthma    Depression    Diabetes mellitus without complication (HCC)    NTIRWERX(540.0)    Hypothyroidism    Iron deficiency anemia    Renal disorder    Past Surgical History:  Procedure Laterality Date   BIOPSY THYROID  2013   laroscopy     thyroid biospy     TUBAL LIGATION  2005   Social History   Socioeconomic History   Marital status: Married    Spouse name: Doctor, general practice    Number of children: 3   Years of education: HS   Highest education level: Not on file  Occupational History   Not on file  Tobacco Use   Smoking status: Never   Smokeless tobacco: Never  Substance and Sexual Activity   Alcohol use: No   Drug use: No   Sexual activity: Not on file  Other Topics Concern   Not on file  Social History Narrative   Patient lives at home with husband Elta [place].    Patient has 3 children.    Patient has high school education and currently in college.    Patient is right handed.    Social Determinants of Health   Financial Resource Strain: Not on file  Food Insecurity: Not on file  Transportation Needs: Not on file  Physical Activity: Not on file  Stress: Not on file  Social Connections: Not on file   Family History  Problem Relation  Age of Onset   Hypertension Mother    Migraines Mother    Sickle cell anemia Father    Allergies  Allergen Reactions   Bee Venom Anaphylaxis   Hydrocodone    Imitrex [Sumatriptan] Nausea And Vomiting    Intolerant to all Tryptan medications   Sulfa Antibiotics    Current Outpatient Medications  Medication Sig Dispense Refill   albuterol (PROVENTIL) (2.5 MG/3ML) 0.083% nebulizer solution Take 3 mLs (2.5 mg total) by nebulization every 6 (six) hours as needed for wheezing or shortness of breath. 75 mL 12   albuterol (VENTOLIN HFA) 108 (90 Base) MCG/ACT inhaler Inhale 1-2 puffs into the lungs every 6 (six) hours as needed for wheezing or shortness of breath. 8 g 2   benzonatate (TESSALON) 100 MG capsule Take 2 capsules (200 mg total) by mouth 3 (three) times daily as needed. 30 capsule 0   famotidine (PEPCID) 20 MG tablet Take 1 tablet (20 mg total) by mouth 2 (two) times daily. 10 tablet 0   ketorolac (TORADOL) 10 MG tablet Take 1 tablet (10 mg total) by mouth  every 8 (eight) hours as needed. (Patient not taking: Reported on 12/30/2015) 30 tablet 1   levothyroxine (SYNTHROID, LEVOTHROID) 112 MCG tablet Take 112 mcg by mouth daily before breakfast.     oseltamivir (TAMIFLU) 75 MG capsule Take 1 capsule (75 mg total) by mouth every 12 (twelve) hours. 10 capsule 0   predniSONE (DELTASONE) 20 MG tablet 2 tabs po daily x 3 days 6 tablet 0   topiramate (TOPAMAX) 25 MG tablet Take one tablet PO at HS for 1 week then increase to 2 tablets PO at HS thereafter. (Patient not taking: Reported on 12/30/2015) 60 tablet 3   No current facility-administered medications for this visit.   No results found.  Review of Systems:   A ROS was performed including pertinent positives and negatives as documented in the HPI.  Physical Exam :   Constitutional: NAD and appears stated age Neurological: Alert and oriented Psych: Appropriate affect and cooperative There were no vitals taken for this visit.    Comprehensive Musculoskeletal Exam:    Tenderness palpation about the PT tendon with significant pes planus deformity.  This does not correct when standing.  Otherwise neurosensory exam is intact.  2 posterior cells pedis pulse  Imaging:   Xray (3 views left ankle): There is flatfoot deformity, otherwise normal   I personally reviewed and interpreted the radiographs.   Assessment:   51 y.o. female with left foot flatfoot deformity which has been going on for quite some time.  Given the fact that she has trialed multiple conservative managements I do believe that she may ultimately benefit from flatfoot correction.  To that effect I would like to plan to refer her to Dr. Doran Durand for further assessment for this.  Plan :    -Plan for left foot referral for discussion of candidacy of surgical correction     I personally saw and evaluated the patient, and participated in the management and treatment plan.  Vanetta Mulders, MD Attending Physician, Orthopedic Surgery  This document was dictated using Dragon voice recognition software. A reasonable attempt at proof reading has been made to minimize errors.

## 2022-10-23 IMAGING — DX DG ANKLE COMPLETE 3+V*L*
3 series · 3 of 3 positions shown · non-contrast
Comparison: Left ankle radiographs 11/15/2021

CLINICAL DATA: Left ankle pain and swelling.

EXAM:
LEFT ANKLE COMPLETE - 3+ VIEW

[ankle ap]
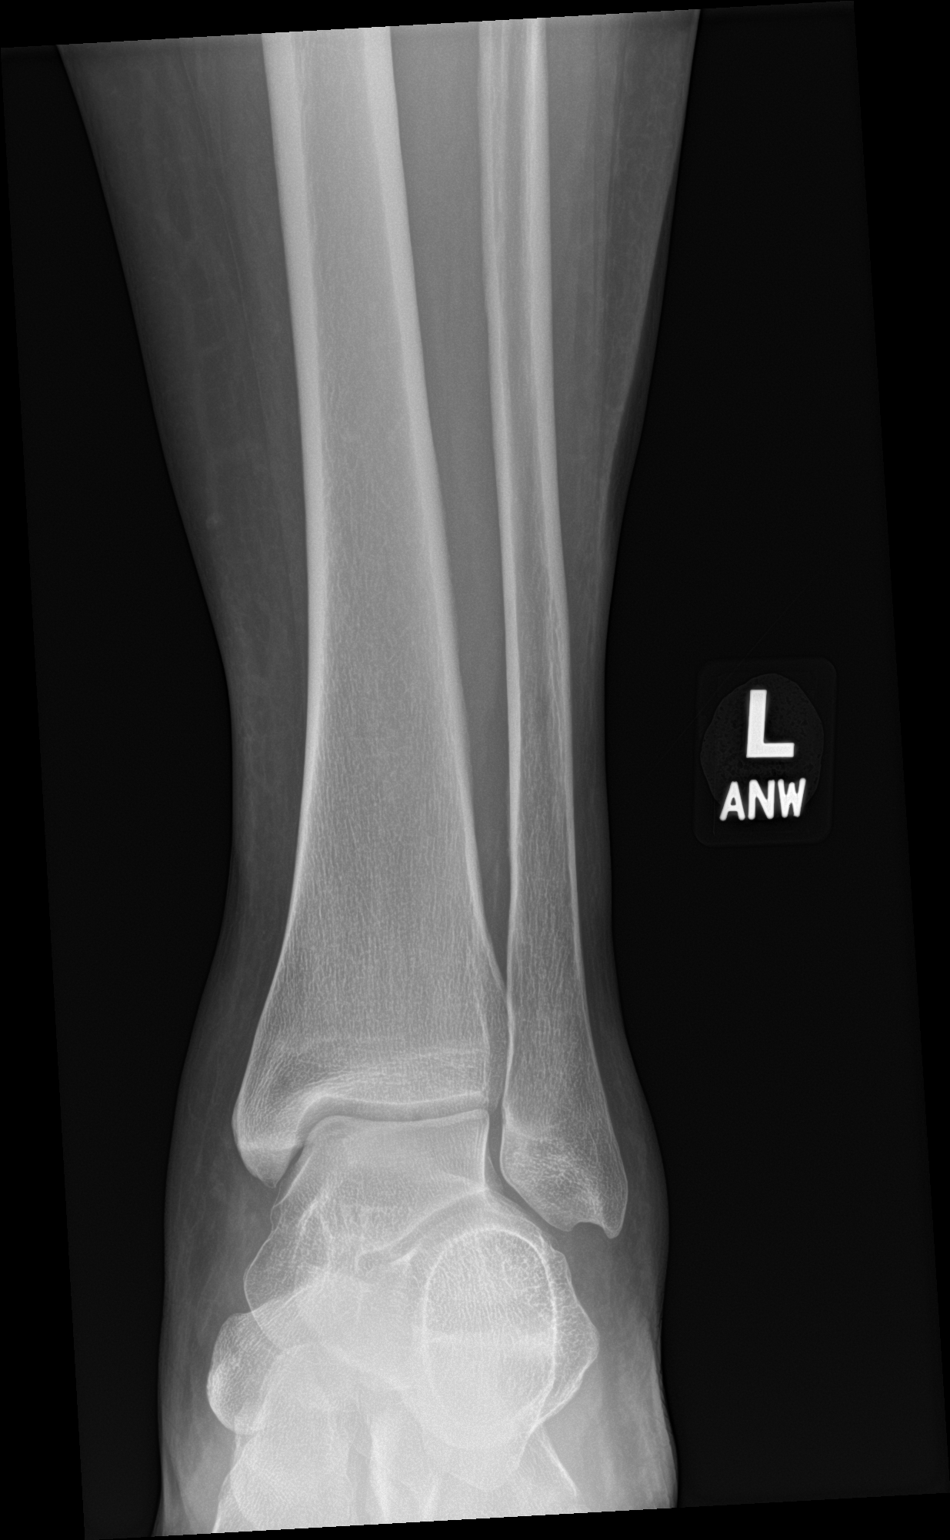

[ankle lat]
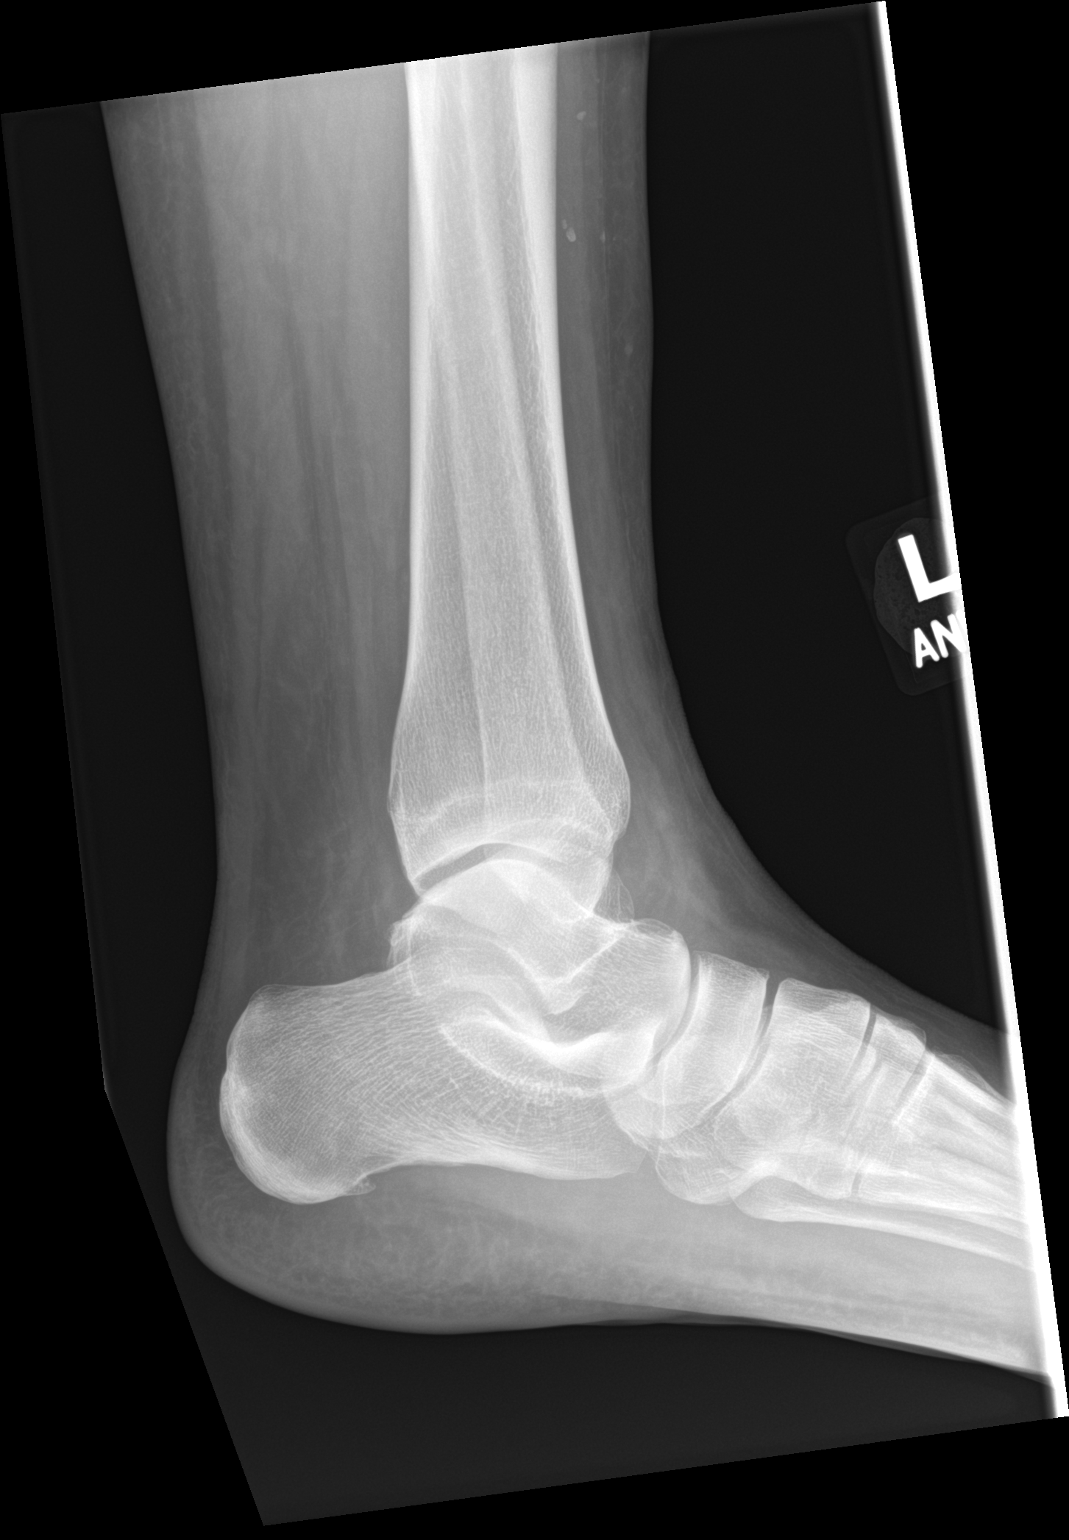

[ankle obl]
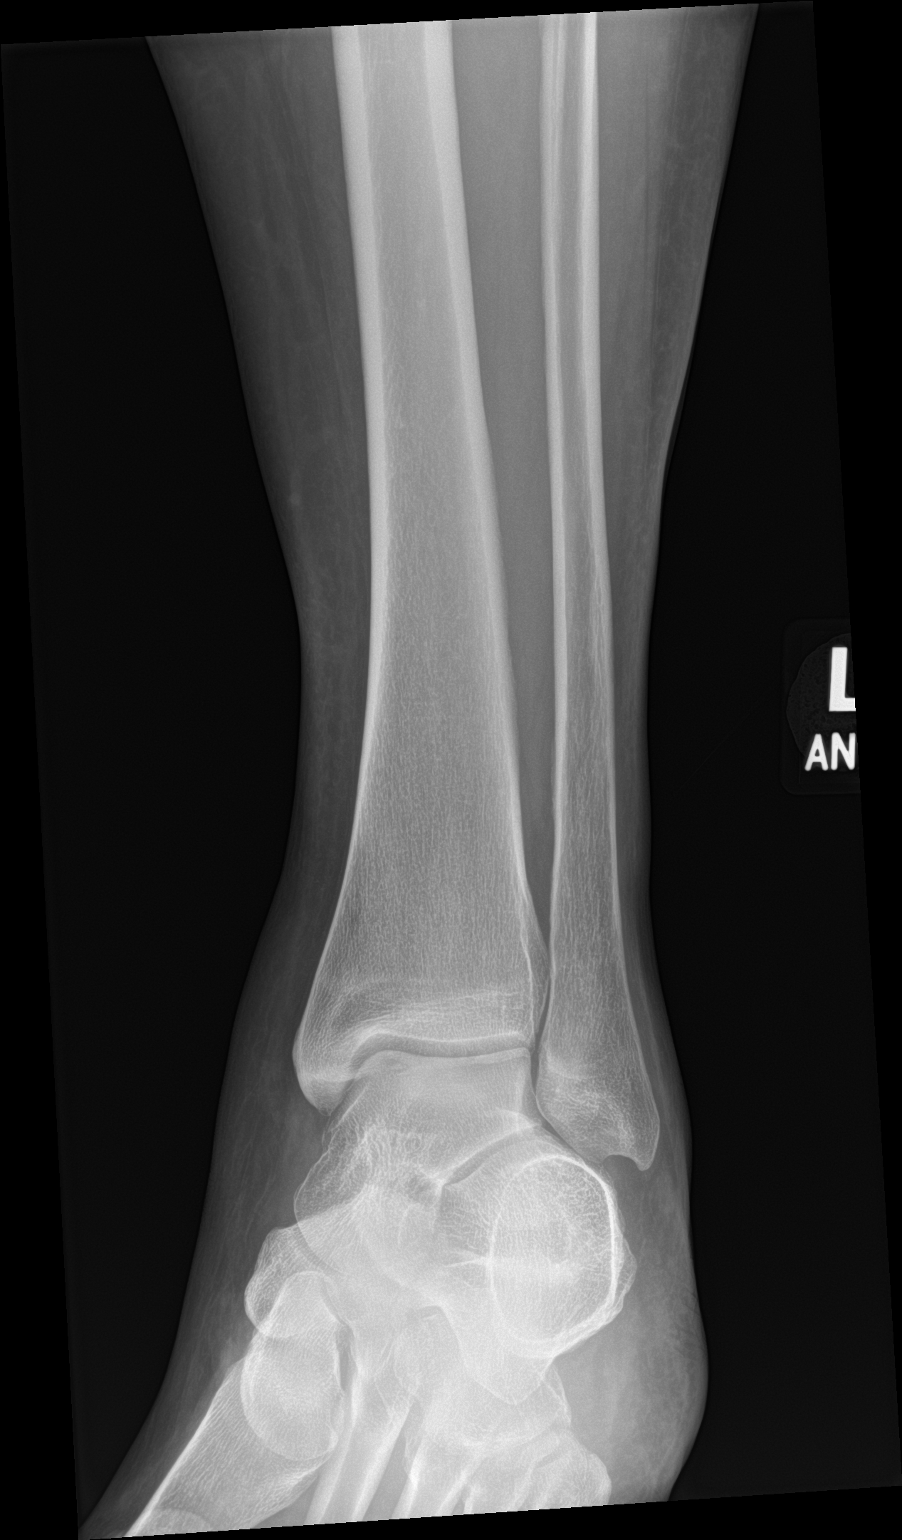

[3 of 3 positions shown; findings below may reference images not displayed]

FINDINGS: The ankle mortise is symmetric and intact. Mild pes planus. Small
plantar calcaneal heel spur. No acute fracture is seen. No
dislocation.
IMPRESSION: No significant change from prior. Mild pes planus and mild plantar
calcaneal heel spur.

## 2023-04-05 ENCOUNTER — Encounter: Payer: Self-pay | Admitting: Adult Health

## 2023-04-05 ENCOUNTER — Ambulatory Visit: Payer: Medicaid Other | Admitting: Adult Health

## 2023-04-05 VITALS — BP 125/78 | HR 70 | Ht 67.0 in | Wt 235.0 lb

## 2023-04-05 DIAGNOSIS — G4733 Obstructive sleep apnea (adult) (pediatric): Secondary | ICD-10-CM

## 2023-04-05 DIAGNOSIS — R0683 Snoring: Secondary | ICD-10-CM | POA: Insufficient documentation

## 2023-04-05 NOTE — Progress Notes (Signed)
@Patient  ID: Jessica [name], female    DOB: 05-24-1971, 52 y.o.   MRN: [id number]  Chief Complaint  Patient presents with   Consult    Referring provider: Randa Lynn, MD  HPI: 52 year old female seen for sleep consult April 05, 2023 for loud snoring, gasping for air, restless sleep, daytime sleepiness Previously diagnosed with sleep apnea in August 2022-  TEST/EVENTS :  Home sleep study January 26, 2021 showed moderate sleep apnea with AHI 22 and SpO2 low at 83%.  04/05/2023 Sleep consult  Presents for sleep consult today for loud snoring, restless sleep, gasping for air, daytime sleepiness.  Kindly referred by Dr. Gracelyn Nurse .  Patient complains of very loud snoring, gasping for air during the nighttime, choking at night, daytime sleepiness.  Patient says she was diagnosed with sleep apnea and August 2022.  She says that she did not have a follow-up visit to discuss sleep study results.  Most results showed that she had moderate sleep apnea with a AHI of 22 and SpO2 low at 83%.  Patient says she does take naps a few times a week typically for about 2 hours at a time.  She has no significant caffeine intake.  No removable dental work.  She has no history of congestive heart failure or stroke.  She does have chronic.  She does not take any sleep aids.  No symptoms suspicious for cataplexy or sleep paralysis.  Epworth score is 13 out of 24.  Typically gets sleepy if she sits down to rest, watch TV, passenger in car.  Current weight is 235 pounds with a BMI of 36.  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  Takes an hour or so to go to sleep.  Gets up about 9 AM.  Social history patient is married lives at home with her husband and children.  She works second shift.  No alcohol or drug use.  Has 3 adult children.  She is raising a 6-year-old child.  Family history positive for allergies  Past Surgical History:  Procedure Laterality Date   BIOPSY  THYROID  2013   laroscopy     thyroid biospy     TUBAL LIGATION  2005      Allergies  Allergen Reactions   Bee Venom Anaphylaxis   Hydrocodone    Imitrex [Sumatriptan] Nausea And Vomiting    Intolerant to all Tryptan medications   Sulfa Antibiotics     Immunization History  Administered Date(s) Administered   Influenza,inj,Quad PF,6+ Mos 09/01/2017    Past Medical History:  Diagnosis Date   Asthma    Depression    Diabetes mellitus without complication (HCC)    Headache(784.0)    Hypothyroidism    Iron deficiency anemia    Renal disorder     Tobacco History: Social History   Tobacco Use  Smoking Status Never  Smokeless Tobacco Never   Counseling given: Not Answered   Outpatient Medications Prior to Visit  Medication Sig Dispense Refill   albuterol (PROVENTIL) (2.5 MG/3ML) 0.083% nebulizer solution Take 3 mLs (2.5 mg total) by nebulization every 6 (six) hours as needed for wheezing or shortness of breath. 75 mL 12   albuterol (VENTOLIN HFA) 108 (90 Base) MCG/ACT inhaler Inhale 1-2 puffs into the lungs every 6 (six) hours as needed for wheezing or shortness of breath. 8 g 2   benzonatate (TESSALON) 100 MG capsule Take 2 capsules (200 mg total) by  mouth 3 (three) times daily as needed. 30 capsule 0   famotidine (PEPCID) 20 MG tablet Take 1 tablet (20 mg total) by mouth 2 (two) times daily. 10 tablet 0   ketorolac (TORADOL) 10 MG tablet Take 1 tablet (10 mg total) by mouth every 8 (eight) hours as needed. (Patient not taking: Reported on 12/30/2015) 30 tablet 1   levothyroxine (SYNTHROID, LEVOTHROID) 112 MCG tablet Take 112 mcg by mouth daily before breakfast.     oseltamivir (TAMIFLU) 75 MG capsule Take 1 capsule (75 mg total) by mouth every 12 (twelve) hours. 10 capsule 0   predniSONE (DELTASONE) 20 MG tablet 2 tabs po daily x 3 days 6 tablet 0   topiramate (TOPAMAX) 25 MG tablet Take one tablet PO at HS for 1 week then increase to 2 tablets PO at HS thereafter.  (Patient not taking: Reported on 12/30/2015) 60 tablet 3   No facility-administered medications prior to visit.     Review of Systems:   Constitutional:   No  weight loss, night sweats,  Fevers, chills, +fatigue, or  lassitude.  HEENT:   No headaches,  Difficulty swallowing,  Tooth/dental problems, or  Sore throat,                No sneezing, itching, ear ache, nasal congestion, post nasal drip,   CV:  No chest pain,  Orthopnea, PND, swelling in lower extremities, anasarca, dizziness, palpitations, syncope.   GI  No heartburn, indigestion, abdominal pain, nausea, vomiting, diarrhea, change in bowel habits, loss of appetite, bloody stools.   Resp: No shortness of breath with exertion or at rest.  No excess mucus, no productive cough,  No non-productive cough,  No coughing up of blood.  No change in color of mucus.  No wheezing.  No chest wall deformity  Skin: no rash or lesions.  GU: no dysuria, change in color of urine, no urgency or frequency.  No flank pain, no hematuria   MS:  No joint pain or swelling.  No decreased range of motion.  No back pain.    Physical Exam  BP 125/78   Pulse 70   Ht 5\' 7"  (1.702 m)   Wt 235 lb (106.6 kg)   SpO2 95%   BMI 36.81 kg/m   GEN: A/Ox3; pleasant , NAD, well nourished    HEENT:  /AT,  EACs-clear, TMs-wnl, NOSE-clear, THROAT-clear, no lesions, no postnasal drip or exudate noted.  Class 3-4 MP airway   NECK:  Supple w/ fair ROM; no JVD; normal carotid impulses w/o bruits; no thyromegaly or nodules palpated; no lymphadenopathy.    RESP  Clear  P & A; w/o, wheezes/ rales/ or rhonchi. no accessory muscle use, no dullness to percussion  CARD:  RRR, no m/r/g, no peripheral edema, pulses intact, no cyanosis or clubbing.  GI:   Soft & nt; nml bowel sounds; no organomegaly or masses detected.   Musco: Warm bil, no deformities or joint swelling noted.   Neuro: alert, no focal deficits noted.    Skin: Warm, no lesions or  rashes    Lab Results:  CBC  BMET   ProBNP No results found for: "PROBNP"  Imaging: No results found.  Administration History     None           No data to display          No results found for: "NITRICOXIDE"      Assessment & Plan:   Snoring Loud snoring, restless sleep,  Daytime sleepiness, gasping for air during the nighttime, diagnosis of sleep apnea all concerning for ongoing sleep apnea.  Will set patient up for home sleep study.  Patient education given on sleep apnea. - discussed how weight can impact sleep and risk for sleep disordered breathing - discussed options to assist with weight loss: combination of diet modification, cardiovascular and strength training exercises   - had an extensive discussion regarding the adverse health consequences related to untreated sleep disordered breathing - specifically discussed the risks for hypertension, coronary artery disease, cardiac dysrhythmias, cerebrovascular disease, and diabetes - lifestyle modification discussed   - discussed how sleep disruption can increase risk of accidents, particularly when driving - safe driving practices were discussed   Plan  Patient Instructions  Set up home sleep study  Work on healthy weight  Do not drive if sleepy  Healthy sleep regimen  Follow up in 6 weeks to discuss sleep study results and treatment plan-in person or virtual.        Rubye Oaks, NP 04/05/2023

## 2023-04-05 NOTE — Assessment & Plan Note (Signed)
Loud snoring, restless sleep, Daytime sleepiness, gasping for air during the nighttime, diagnosis of sleep apnea all concerning for ongoing sleep apnea.  Will set patient up for home sleep study.  Patient education given on sleep apnea. - discussed how weight can impact sleep and risk for sleep disordered breathing - discussed options to assist with weight loss: combination of diet modification, cardiovascular and strength training exercises   - had an extensive discussion regarding the adverse health consequences related to untreated sleep disordered breathing - specifically discussed the risks for hypertension, coronary artery disease, cardiac dysrhythmias, cerebrovascular disease, and diabetes - lifestyle modification discussed   - discussed how sleep disruption can increase risk of accidents, particularly when driving - safe driving practices were discussed   Plan  Patient Instructions  Set up home sleep study  Work on healthy weight  Do not drive if sleepy  Healthy sleep regimen  Follow up in 6 weeks to discuss sleep study results and treatment plan-in person or virtual.

## 2023-04-05 NOTE — Patient Instructions (Signed)
Set up home sleep study  Work on healthy weight  Do not drive if sleepy  Healthy sleep regimen  Follow up in 6 weeks to discuss sleep study results and treatment plan-in person or virtual.

## 2023-04-19 ENCOUNTER — Ambulatory Visit: Payer: Medicaid Other

## 2023-04-19 DIAGNOSIS — G4733 Obstructive sleep apnea (adult) (pediatric): Secondary | ICD-10-CM

## 2023-06-02 ENCOUNTER — Emergency Department (HOSPITAL_COMMUNITY)
Admission: EM | Admit: 2023-06-02 | Discharge: 2023-06-02 | Disposition: A | Discharge status: Home / Self Care | Payer: Medicaid Other | Attending: Emergency Medicine | Admitting: Emergency Medicine

## 2023-06-02 ENCOUNTER — Emergency Department (HOSPITAL_COMMUNITY): Payer: Medicaid Other

## 2023-06-02 ENCOUNTER — Other Ambulatory Visit: Payer: Self-pay

## 2023-06-02 ENCOUNTER — Encounter (HOSPITAL_COMMUNITY): Payer: Self-pay

## 2023-06-02 DIAGNOSIS — G4459 Other complicated headache syndrome: Secondary | ICD-10-CM | POA: Diagnosis not present

## 2023-06-02 DIAGNOSIS — G43109 Migraine with aura, not intractable, without status migrainosus: Secondary | ICD-10-CM

## 2023-06-02 DIAGNOSIS — E119 Type 2 diabetes mellitus without complications: Secondary | ICD-10-CM | POA: Insufficient documentation

## 2023-06-02 DIAGNOSIS — R200 Anesthesia of skin: Secondary | ICD-10-CM

## 2023-06-02 DIAGNOSIS — Z7951 Long term (current) use of inhaled steroids: Secondary | ICD-10-CM | POA: Insufficient documentation

## 2023-06-02 DIAGNOSIS — J45909 Unspecified asthma, uncomplicated: Secondary | ICD-10-CM | POA: Insufficient documentation

## 2023-06-02 DIAGNOSIS — R42 Dizziness and giddiness: Secondary | ICD-10-CM | POA: Diagnosis present

## 2023-06-02 DIAGNOSIS — R209 Unspecified disturbances of skin sensation: Secondary | ICD-10-CM | POA: Diagnosis not present

## 2023-06-02 DIAGNOSIS — R29818 Other symptoms and signs involving the nervous system: Secondary | ICD-10-CM

## 2023-06-02 DIAGNOSIS — Z79899 Other long term (current) drug therapy: Secondary | ICD-10-CM | POA: Insufficient documentation

## 2023-06-02 LAB — CBC
HCT: 37 % (ref 36.0–46.0)
Hemoglobin: 12 g/dL (ref 12.0–15.0)
MCH: 28.2 pg (ref 26.0–34.0)
MCHC: 32.4 g/dL (ref 30.0–36.0)
MCV: 87.1 fL (ref 80.0–100.0)
Platelets: 229 10*3/uL (ref 150–400)
RBC: 4.25 MIL/uL (ref 3.87–5.11)
RDW: 13.9 % (ref 11.5–15.5)
WBC: 5.8 10*3/uL (ref 4.0–10.5)
nRBC: 0 % (ref 0.0–0.2)

## 2023-06-02 LAB — RAPID URINE DRUG SCREEN, HOSP PERFORMED
Amphetamines: NOT DETECTED
Barbiturates: NOT DETECTED
Benzodiazepines: NOT DETECTED
Cocaine: NOT DETECTED
Opiates: NOT DETECTED
Tetrahydrocannabinol: NOT DETECTED

## 2023-06-02 LAB — CBG MONITORING, ED: Glucose-Capillary: 91 mg/dL (ref 70–99)

## 2023-06-02 LAB — URINALYSIS, ROUTINE W REFLEX MICROSCOPIC
Bilirubin Urine: NEGATIVE
Glucose, UA: NEGATIVE mg/dL
Hgb urine dipstick: NEGATIVE
Ketones, ur: NEGATIVE mg/dL
Leukocytes,Ua: NEGATIVE
Nitrite: NEGATIVE
Protein, ur: NEGATIVE mg/dL
Specific Gravity, Urine: 1.009 (ref 1.005–1.030)
pH: 6 (ref 5.0–8.0)

## 2023-06-02 LAB — DIFFERENTIAL
Abs Immature Granulocytes: 0.01 10*3/uL (ref 0.00–0.07)
Basophils Absolute: 0 10*3/uL (ref 0.0–0.1)
Basophils Relative: 1 %
Eosinophils Absolute: 0.3 10*3/uL (ref 0.0–0.5)
Eosinophils Relative: 4 %
Immature Granulocytes: 0 %
Lymphocytes Relative: 44 %
Lymphs Abs: 2.6 10*3/uL (ref 0.7–4.0)
Monocytes Absolute: 0.4 10*3/uL (ref 0.1–1.0)
Monocytes Relative: 7 %
Neutro Abs: 2.5 10*3/uL (ref 1.7–7.7)
Neutrophils Relative %: 44 %

## 2023-06-02 LAB — COMPREHENSIVE METABOLIC PANEL
ALT: 23 U/L (ref 0–44)
AST: 22 U/L (ref 15–41)
Albumin: 3.9 g/dL (ref 3.5–5.0)
Alkaline Phosphatase: 63 U/L (ref 38–126)
Anion gap: 7 (ref 5–15)
BUN: 15 mg/dL (ref 6–20)
CO2: 25 mmol/L (ref 22–32)
Calcium: 9.4 mg/dL (ref 8.9–10.3)
Chloride: 106 mmol/L (ref 98–111)
Creatinine, Ser: 1.27 mg/dL — ABNORMAL HIGH (ref 0.44–1.00)
GFR, Estimated: 52 mL/min — ABNORMAL LOW (ref >60)
Glucose, Bld: 96 mg/dL (ref 70–99)
Potassium: 3.8 mmol/L (ref 3.5–5.1)
Sodium: 138 mmol/L (ref 135–145)
Total Bilirubin: 0.7 mg/dL (ref <1.2)
Total Protein: 7 g/dL (ref 6.5–8.1)

## 2023-06-02 LAB — ETHANOL: Alcohol, Ethyl (B): <10 mg/dL (ref <10)

## 2023-06-02 MED ORDER — IOHEXOL 350 MG/ML SOLN: 75.0000 mL | Freq: Once | INTRAVENOUS | Status: DC | PRN

## 2023-06-02 MED ORDER — LORAZEPAM 2 MG/ML IJ SOLN
1.0000 mg | INTRAMUSCULAR | Status: AC
  Administered 2023-06-02: 1 mg via INTRAVENOUS
  Filled 2023-06-02: qty 1

## 2023-06-02 MED ORDER — MECLIZINE HCL 12.5 MG PO TABS
25.0000 mg | ORAL_TABLET | Freq: Once | ORAL | Status: AC
  Administered 2023-06-02: 25 mg via ORAL
  Filled 2023-06-02: qty 2

## 2023-06-02 MED ORDER — MECLIZINE HCL 25 MG PO TABS: 25.0000 mg | ORAL_TABLET | Freq: Three times a day (TID) | ORAL | 0 refills | Status: AC | PRN

## 2023-06-02 MED ORDER — ACETAMINOPHEN 500 MG PO TABS
1000.0000 mg | ORAL_TABLET | Freq: Once | ORAL | Status: AC
  Administered 2023-06-02: 1000 mg via ORAL
  Filled 2023-06-02: qty 2

## 2023-06-02 MED ORDER — DIAZEPAM 5 MG/ML IJ SOLN
2.5000 mg | Freq: Once | INTRAMUSCULAR | Status: AC
  Administered 2023-06-02: 2.5 mg via INTRAVENOUS
  Filled 2023-06-02: qty 2

## 2023-06-02 NOTE — ED Notes (Signed)
Pt ambulated from bathroom to bed with minimal assistance. Pt now resting with call light in reach.

## 2023-06-02 NOTE — ED Provider Notes (Signed)
[place] EMERGENCY DEPARTMENT AT [facility] Provider Note   CSN: [id number] Arrival date & time: 06/02/23  1746     History {Add pertinent medical, surgical, social history, OB history to HPI:1} Chief Complaint  Patient presents with   Dizziness    Jessica [name] is a 52 y.o. female.  52 year old female with a history of headaches, depression, asthma, and diabetes who presents to the emergency department with dizziness and facial numbness.  Patient reports that all week she has been having intermittent vertigo sensation.  No hearing loss or tinnitus.  Says that today approximately an hour prior to arrival had acute onset of room spinning sensation as well as numbness on the right side of her face.  Feels that her right hand is becoming weak as well.  No history of stroke.  Not on blood thinners.       Home Medications Prior to Admission medications   Medication Sig Start Date End Date Taking? Authorizing Provider  albuterol (PROVENTIL) (2.5 MG/3ML) 0.083% nebulizer solution Take 3 mLs (2.5 mg total) by nebulization every 6 (six) hours as needed for wheezing or shortness of breath. 08/07/19   Terald Sleeper, MD  albuterol (VENTOLIN HFA) 108 (90 Base) MCG/ACT inhaler Inhale 1-2 puffs into the lungs every 6 (six) hours as needed for wheezing or shortness of breath. 08/07/19   Terald Sleeper, MD      Allergies    Bee venom, Hydrocodone, Imitrex [sumatriptan], Shellfish allergy, and Sulfa antibiotics    Review of Systems   Review of Systems  Physical Exam Updated Vital Signs BP 130/87 (BP Location: Right Arm)   Pulse (!) 58   Temp 98.9 F (37.2 C) (Oral)   Resp 20   Ht 5\' 7"  (1.702 m)   Wt 107 kg   SpO2 99%   BMI 36.96 kg/m  Physical Exam Vitals and nursing note reviewed.  Constitutional:      General: She is not in acute distress.    Appearance: She is well-developed.  HENT:     Head: Normocephalic and atraumatic.     Right Ear: External ear normal.      Left Ear: External ear normal.     Nose: Nose normal.  Eyes:     Extraocular Movements: Extraocular movements intact.     Conjunctiva/sclera: Conjunctivae normal.     Pupils: Pupils are equal, round, and reactive to light.  Cardiovascular:     Rate and Rhythm: Normal rate and regular rhythm.     Heart sounds: No murmur heard. Pulmonary:     Effort: Pulmonary effort is normal. No respiratory distress.     Breath sounds: Normal breath sounds.  Abdominal:     General: Abdomen is flat. There is no distension.     Palpations: Abdomen is soft. There is no mass.     Tenderness: There is no abdominal tenderness. There is no guarding.  Musculoskeletal:     Cervical back: Normal range of motion and neck supple.     Right lower leg: No edema.     Left lower leg: No edema.  Skin:    General: Skin is warm and dry.  Neurological:     Mental Status: She is alert.     Comments: MENTAL STATUS: AAOx3 CRANIAL NERVES: II: Pupils equal and reactive 4 mm BL, no RAPD, no VF deficits III, IV, VI: EOM intact, no gaze preference or deviation, no nystagmus. V: Diminished sensation in V3 distribution on the right VII:  no facial weakness or asymmetry, no nasolabial fold flattening VIII: normal hearing to speech and finger friction IX, X: normal palatal elevation, no uvular deviation XI: 5/5 head turn and 5/5 shoulder shrug bilaterally XII: midline tongue protrusion MOTOR: 5/5 strength in R shoulder flexion, elbow flexion and extension, and grip strength. 5/5 strength in L shoulder flexion, elbow flexion and extension, and grip strength.  5/5 strength in R hip and knee flexion, knee extension, ankle plantar and dorsiflexion. 5/5 strength in L hip and knee flexion, knee extension, ankle plantar and dorsiflexion. SENSORY: Normal sensation to light touch in all extremities COORD: Normal finger to nose and heel to shin, no tremor, no dysmetria STATION: normal stance, no truncal ataxia GAIT: Unable to walk  without assistance   Psychiatric:        Mood and Affect: Mood normal.     ED Results / Procedures / Treatments   Labs (all labs ordered are listed, but only abnormal results are displayed) Labs Reviewed  ETHANOL  CBC  DIFFERENTIAL  COMPREHENSIVE METABOLIC PANEL  RAPID URINE DRUG SCREEN, HOSP PERFORMED  URINALYSIS, ROUTINE W REFLEX MICROSCOPIC  CBG MONITORING, ED  I-STAT CHEM 8, ED    EKG None  Radiology No results found.  Procedures Procedures  {Document cardiac monitor, telemetry assessment procedure when appropriate:1}  Medications Ordered in ED Medications  iohexol (OMNIPAQUE) 350 MG/ML injection 75 mL (has no administration in time range)  LORazepam (ATIVAN) injection 1 mg (1 mg Intravenous Given 06/02/23 1825)    ED Course/ Medical Decision Making/ A&P   {   Click here for ABCD2, HEART and other calculatorsREFRESH Note before signing :1}                              Medical Decision Making Amount and/or Complexity of Data Reviewed Labs: ordered. Radiology: ordered.  Risk Prescription drug management.   ***  {Document critical care time when appropriate:1} {Document review of labs and clinical decision tools ie heart score, Chads2Vasc2 etc:1}  {Document your independent review of radiology images, and any outside records:1} {Document your discussion with family members, caretakers, and with consultants:1} {Document social determinants of health affecting pt's care:1} {Document your decision making why or why not admission, treatments were needed:1} Final Clinical Impression(s) / ED Diagnoses Final diagnoses:  None    Rx / DC [name] ED Discharge [name]     None

## 2023-06-02 NOTE — Progress Notes (Addendum)
1823- Code stroke activation, EDP has evaluated.  51- To CT 76- Dr Amada Jupiter on camera [phone]- Pt taken to room for assessment 1848- No TNK - LKW determined to be 5 days ago. Pt is being taken for MRI now.   MRS 0

## 2023-06-02 NOTE — ED Triage Notes (Signed)
Pt arrives with reports of feeling dizzy while at work today. States she was seen for similar earlier in the week and given medication for vertigo. States that today she started having right sided facial tingling as well. Tingling started 30 min PTA.

## 2023-06-02 NOTE — Discharge Instructions (Signed)
You were seen for your dizziness and facial numbness in the emergency department.  It is likely from a condition called a complicated migraine.  At home, please take Tylenol and ibuprofen for your headache.  Take the meclizine for dizziness.    Check your MyChart online for the results of any tests that had not resulted by the time you left the emergency department.   Follow-up with your primary doctor in 2-3 days regarding your visit.    Return immediately to the emergency department if you experience any of the following: New numbness or weakness, or any other concerning symptoms.    Thank you for visiting our Emergency Department. It was a pleasure taking care of you today.

## 2023-06-02 NOTE — Consult Note (Signed)
[facility] Telemedicine Consult   Requesting Provider: Arminda Resides Consult Participants: Bedside nurse, telestroke nurse Location of the provider: [place], [place] Location of the patient: [facility]  This consult was provided via telemedicine with 2-way video and audio communication. The patient/family was informed that care would be provided in this way and agreed to receive care in this manner.    Chief Complaint: Dizziness  HPI: 52 year old female who presents with dizziness and right-sided facial tingling.  She initially experienced some dizziness 5 days ago and presented to an emergency department where she was treated for vertigo and went home.  She states that she was better, but never came completely back to normal in the intervening timeframe.  Tonight, she began having tingling around her right eye and then over the next 45 minutes the tingling spread to involve the entire right side of her face as well as her fingertips.  She also notices that she feels dizzy, especially with movement.  She describes it as a room spinning patient.  The ER physician had her walk and she was unsteady and therefore a code stroke was activated.    LKW: 5 days ago tnk given?: No, outside of window IR Thrombectomy? No, minimal symptoms Modified Rankin Scale: 0-Completely asymptomatic and back to baseline post- stroke Time of teleneurologist evaluation: 18:32  Exam: Vitals:   06/02/23 1759  BP: 130/87  Pulse: (!) 58  Resp: 20  Temp: 98.9 F (37.2 C)  SpO2: 99%    General: In bed, NAD  1A: Level of Consciousness - 0 1B: Ask Month and Age - 0 1C: 'Blink Eyes' & 'Squeeze Hands' - 0 2: Test Horizontal Extraocular Movements - 0 3: Test Visual Fields - 0 4: Test Facial Palsy - 0 5A: Test Left Arm Motor Drift - 0 5B: Test Right Arm Motor Drift - 0 6A: Test Left Leg Motor Drift - 0 6B: Test Right Leg Motor Drift - 0 7: Test Limb Ataxia - 0 8: Test Sensation -  0 9: Test Language/Aphasia- 0 10: Test Dysarthria - 0 11: Test Extinction/Inattention - 0 NIHSS score: Zero   Imaging Reviewed: CT head-negative  Labs reviewed in epic and pertinent values follow: Glucose-91   Assessment: 52 year old female with a history of migraines who presents with right facial paresthesia that started around her cheek and spread down to involve her face and fingertips over 45 minutes.  This is coupled with the sensation of dizziness as well as a mild headache.  My suspicion is that this does represent complicated migraine, however I would favor getting further imaging given the symptoms that she is describing and the fact that this is relatively new for her.  If MRI does not reveal any evidence of a stroke, and CTA did not reveal any evidence of severe posterior circulation disease, then I would favor treating this as complicated migraine.  Even if this were to represent acute ischemic stroke, given some mild symptoms over the past few days I do not think she is a candidate for tenecteplase.  Recommendations: 1) MRI brain, MRA head and neck 2) if the above is negative, then I would treat as complicated migraine 3) if positive, then she will need to be admitted for stroke workup.   Ritta Slot, MD [facility] [phone]  If 7pm- 7am, please page neurology on call as listed in AMION.

## 2023-06-02 NOTE — ED Notes (Signed)
Pt returned from MRI °

## 2023-06-26 ENCOUNTER — Ambulatory Visit (INDEPENDENT_AMBULATORY_CARE_PROVIDER_SITE_OTHER): Payer: BC Managed Care – PPO | Admitting: Adult Health

## 2023-06-26 ENCOUNTER — Encounter: Payer: Self-pay | Admitting: Adult Health

## 2023-06-26 VITALS — BP 119/76 | HR 72 | Ht 67.0 in | Wt 239.0 lb

## 2023-06-26 DIAGNOSIS — G4733 Obstructive sleep apnea (adult) (pediatric): Secondary | ICD-10-CM | POA: Diagnosis not present

## 2023-06-26 NOTE — Patient Instructions (Signed)
Begin CPAP at bedtime, wear all night long for at least 6 or more hours Do not drive if sleepy Work on healthy weight loss Follow-up in 3 months and as needed

## 2023-06-26 NOTE — Progress Notes (Unsigned)
@Patient  ID: Jessica [name], female    DOB: [birth year], 53 y.o.   MRN: [id number]  Chief Complaint  Patient presents with   Follow-up    Referring provider: Kirstie Peri, MD  HPI: 53 year old female seen for sleep consult April 05, 2023 for snoring daytime sleepiness  Previously diagnosed with sleep apnea in 2022  SH :  Social history patient is married lives at home with her husband and children. She works second shift. No alcohol or drug use. Has 3 adult children. She is raising a 56-year-old child.   TEST/EVENTS :  Home sleep study January 26, 2021 showed moderate sleep apnea with AHI 22 and SpO2 low at 83%.   06/26/2023 Follow up: OSA  Patient presents for a follow-up visit.  Patient was seen April 05, 2023 for sleep consult for snoring and daytime sleepiness. She carries previous diagnosis of sleep apnea with previous sleep study in August 2022 that showed moderate sleep apnea.  She was set up for home sleep study that was done    Allergies  Allergen Reactions   Bee Venom Anaphylaxis   Iodinated Contrast Media Hives   Hydrocodone    Imitrex [Sumatriptan] Nausea And Vomiting    Intolerant to all Tryptan medications   Shellfish Allergy    Sulfa Antibiotics     Immunization History  Administered Date(s) Administered   Influenza,inj,Quad PF,6+ Mos 09/01/2017    Past Medical History:  Diagnosis Date   Asthma    Depression    Diabetes mellitus without complication (HCC)    Headache(784.0)    Hypothyroidism    Iron deficiency anemia    Renal disorder     Tobacco History: Social History   Tobacco Use  Smoking Status Never  Smokeless Tobacco Never   Counseling given: Not Answered   Outpatient Medications Prior to Visit  Medication Sig Dispense Refill   albuterol (PROVENTIL) (2.5 MG/3ML) 0.083% nebulizer solution Take 3 mLs (2.5 mg total) by nebulization every 6 (six) hours as needed for wheezing or shortness of breath. 75 mL 12   albuterol (VENTOLIN HFA)  108 (90 Base) MCG/ACT inhaler Inhale 1-2 puffs into the lungs every 6 (six) hours as needed for wheezing or shortness of breath. 8 g 2   meclizine (ANTIVERT) 25 MG tablet Take 1 tablet (25 mg total) by mouth 3 (three) times daily as needed for dizziness. 30 tablet 0   No facility-administered medications prior to visit.     Review of Systems:   Constitutional:   No  weight loss, night sweats,  Fevers, chills, fatigue, or  lassitude.  HEENT:   No headaches,  Difficulty swallowing,  Tooth/dental problems, or  Sore throat,                No sneezing, itching, ear ache, nasal congestion, post nasal drip,   CV:  No chest pain,  Orthopnea, PND, swelling in lower extremities, anasarca, dizziness, palpitations, syncope.   GI  No heartburn, indigestion, abdominal pain, nausea, vomiting, diarrhea, change in bowel habits, loss of appetite, bloody stools.   Resp: No shortness of breath with exertion or at rest.  No excess mucus, no productive cough,  No non-productive cough,  No coughing up of blood.  No change in color of mucus.  No wheezing.  No chest wall deformity  Skin: no rash or lesions.  GU: no dysuria, change in color of urine, no urgency or frequency.  No flank pain, no hematuria   MS:  No joint pain or  swelling.  No decreased range of motion.  No back pain.    Physical Exam  There were no vitals taken for this visit.  GEN: A/Ox3; pleasant , NAD, well nourished    HEENT:  [place]/AT,  EACs-clear, TMs-wnl, NOSE-clear, THROAT-clear, no lesions, no postnasal drip or exudate noted.   NECK:  Supple w/ fair ROM; no JVD; normal carotid impulses w/o bruits; no thyromegaly or nodules palpated; no lymphadenopathy.    RESP  Clear  P & A; w/o, wheezes/ rales/ or rhonchi. no accessory muscle use, no dullness to percussion  CARD:  RRR, no m/r/g, no peripheral edema, pulses intact, no cyanosis or clubbing.  GI:   Soft & nt; nml bowel sounds; no organomegaly or masses detected.   Musco: Warm  bil, no deformities or joint swelling noted.   Neuro: alert, no focal deficits noted.    Skin: Warm, no lesions or rashes    Lab Results:  CBC    Component Value Date/Time   WBC 5.8 06/02/2023 1824   RBC 4.25 06/02/2023 1824   HGB 12.0 06/02/2023 1824   HCT 37.0 06/02/2023 1824   PLT 229 06/02/2023 1824   MCV 87.1 06/02/2023 1824   MCH 28.2 06/02/2023 1824   MCHC 32.4 06/02/2023 1824   RDW 13.9 06/02/2023 1824   LYMPHSABS 2.6 06/02/2023 1824   MONOABS 0.4 06/02/2023 1824   EOSABS 0.3 06/02/2023 1824   BASOSABS 0.0 06/02/2023 1824    BMET    Component Value Date/Time   NA 138 06/02/2023 1824   K 3.8 06/02/2023 1824   CL 106 06/02/2023 1824   CO2 25 06/02/2023 1824   GLUCOSE 96 06/02/2023 1824   BUN 15 06/02/2023 1824   CREATININE 1.27 (H) 06/02/2023 1824   CALCIUM 9.4 06/02/2023 1824   GFRNONAA 52 (L) 06/02/2023 1824    BNP No results found for: "BNP"  ProBNP No results found for: "PROBNP"  Imaging: MR BRAIN WO CONTRAST Result Date: 06/02/2023 CLINICAL DATA:  Right facial numbness and tingling, vertigo EXAM: MRI HEAD WITHOUT CONTRAST MRA HEAD WITHOUT CONTRAST MRA NECK WITHOUT CONTRAST TECHNIQUE: Multiplanar, multi-echo pulse sequences of the brain and surrounding structures were acquired without intravenous contrast. Angiographic images of the Circle of Willis were acquired using MRA technique without intravenous contrast. Angiographic images of the neck were acquired using MRA technique without intravenous contrast. Carotid stenosis measurements (when applicable) are obtained utilizing NASCET criteria, using the distal internal carotid diameter as the denominator. COMPARISON:  04/21/2005 MRI and MRA head, 06/02/2023 CT head FINDINGS: MRI HEAD FINDINGS Brain: No restricted diffusion to suggest acute or subacute infarct. No acute hemorrhage, mass, mass effect, or midline shift. No hydrocephalus or extra-axial collection. Pituitary and craniocervical junction within  normal limits. No hemosiderin deposition to suggest remote hemorrhage. Vascular: Normal arterial flow voids. Skull and upper cervical spine: Normal marrow signal. Sinuses/Orbits: Clear paranasal sinuses. No acute finding in the orbits. Other: The mastoid air cells are well aerated. MRA HEAD FINDINGS Anterior circulation: Both internal carotid arteries are patent to the termini, without significant stenosis. A1 segments patent. Normal anterior communicating artery. Anterior cerebral arteries are patent to their distal aspects without significant stenosis. No M1 stenosis or occlusion. Distal MCA branches perfused to their distal aspects without significant stenosis. Posterior circulation: Vertebral arteries patent to the vertebrobasilar junction without stenosis. Posterior inferior cerebral arteries patent bilaterally. Basilar patent to its distal aspect. Superior cerebellar arteries patent proximally. Patent P1 segments. PCAs perfused to their distal aspects without significant stenosis. The  bilateral posterior communicating arteries are patent. Anatomic variants: None significant MRA NECK FINDINGS Evaluation is limited in the absence of intravenous contrast. Aortic arch: Poorly visualized. No evidence of dissection or aneurysm. Right carotid system: Poor visualization of the proximal right common carotid artery. The mid to distal right common carotid is patent. No evidence of dissection, occlusion, or hemodynamically significant stenosis (greater than 50%) in the internal carotid artery. Left carotid system: Poor visualization of the proximal left common carotid artery. The mid to distal left common carotid is patent. No evidence of dissection, occlusion, or hemodynamically significant stenosis (greater than 50%) in the internal carotid artery. Vertebral arteries: Poor visualization of the vertebral artery origins and proximal V1 segments. The vertebral arteries are otherwise patent to the skull base, without  significant stenosis or evidence of dissection. Other: None. IMPRESSION: 1. No acute intracranial process. No evidence of acute or subacute infarct. 2. No intracranial large vessel occlusion or significant stenosis. 3. No hemodynamically significant stenosis in the neck. Electronically Signed   By: Wiliam Ke M.D.   On: 06/02/2023 21:25   MR ANGIO HEAD WO CONTRAST Result Date: 06/02/2023 CLINICAL DATA:  Right facial numbness and tingling, vertigo EXAM: MRI HEAD WITHOUT CONTRAST MRA HEAD WITHOUT CONTRAST MRA NECK WITHOUT CONTRAST TECHNIQUE: Multiplanar, multi-echo pulse sequences of the brain and surrounding structures were acquired without intravenous contrast. Angiographic images of the Circle of Willis were acquired using MRA technique without intravenous contrast. Angiographic images of the neck were acquired using MRA technique without intravenous contrast. Carotid stenosis measurements (when applicable) are obtained utilizing NASCET criteria, using the distal internal carotid diameter as the denominator. COMPARISON:  04/21/2005 MRI and MRA head, 06/02/2023 CT head FINDINGS: MRI HEAD FINDINGS Brain: No restricted diffusion to suggest acute or subacute infarct. No acute hemorrhage, mass, mass effect, or midline shift. No hydrocephalus or extra-axial collection. Pituitary and craniocervical junction within normal limits. No hemosiderin deposition to suggest remote hemorrhage. Vascular: Normal arterial flow voids. Skull and upper cervical spine: Normal marrow signal. Sinuses/Orbits: Clear paranasal sinuses. No acute finding in the orbits. Other: The mastoid air cells are well aerated. MRA HEAD FINDINGS Anterior circulation: Both internal carotid arteries are patent to the termini, without significant stenosis. A1 segments patent. Normal anterior communicating artery. Anterior cerebral arteries are patent to their distal aspects without significant stenosis. No M1 stenosis or occlusion. Distal MCA branches  perfused to their distal aspects without significant stenosis. Posterior circulation: Vertebral arteries patent to the vertebrobasilar junction without stenosis. Posterior inferior cerebral arteries patent bilaterally. Basilar patent to its distal aspect. Superior cerebellar arteries patent proximally. Patent P1 segments. PCAs perfused to their distal aspects without significant stenosis. The bilateral posterior communicating arteries are patent. Anatomic variants: None significant MRA NECK FINDINGS Evaluation is limited in the absence of intravenous contrast. Aortic arch: Poorly visualized. No evidence of dissection or aneurysm. Right carotid system: Poor visualization of the proximal right common carotid artery. The mid to distal right common carotid is patent. No evidence of dissection, occlusion, or hemodynamically significant stenosis (greater than 50%) in the internal carotid artery. Left carotid system: Poor visualization of the proximal left common carotid artery. The mid to distal left common carotid is patent. No evidence of dissection, occlusion, or hemodynamically significant stenosis (greater than 50%) in the internal carotid artery. Vertebral arteries: Poor visualization of the vertebral artery origins and proximal V1 segments. The vertebral arteries are otherwise patent to the skull base, without significant stenosis or evidence of dissection. Other: None. IMPRESSION: 1. No  acute intracranial process. No evidence of acute or subacute infarct. 2. No intracranial large vessel occlusion or significant stenosis. 3. No hemodynamically significant stenosis in the neck. Electronically Signed   By: Wiliam Ke M.D.   On: 06/02/2023 21:25   MR ANGIO NECK WO CONTRAST Result Date: 06/02/2023 CLINICAL DATA:  Right facial numbness and tingling, vertigo EXAM: MRI HEAD WITHOUT CONTRAST MRA HEAD WITHOUT CONTRAST MRA NECK WITHOUT CONTRAST TECHNIQUE: Multiplanar, multi-echo pulse sequences of the brain and  surrounding structures were acquired without intravenous contrast. Angiographic images of the Circle of Willis were acquired using MRA technique without intravenous contrast. Angiographic images of the neck were acquired using MRA technique without intravenous contrast. Carotid stenosis measurements (when applicable) are obtained utilizing NASCET criteria, using the distal internal carotid diameter as the denominator. COMPARISON:  04/21/2005 MRI and MRA head, 06/02/2023 CT head FINDINGS: MRI HEAD FINDINGS Brain: No restricted diffusion to suggest acute or subacute infarct. No acute hemorrhage, mass, mass effect, or midline shift. No hydrocephalus or extra-axial collection. Pituitary and craniocervical junction within normal limits. No hemosiderin deposition to suggest remote hemorrhage. Vascular: Normal arterial flow voids. Skull and upper cervical spine: Normal marrow signal. Sinuses/Orbits: Clear paranasal sinuses. No acute finding in the orbits. Other: The mastoid air cells are well aerated. MRA HEAD FINDINGS Anterior circulation: Both internal carotid arteries are patent to the termini, without significant stenosis. A1 segments patent. Normal anterior communicating artery. Anterior cerebral arteries are patent to their distal aspects without significant stenosis. No M1 stenosis or occlusion. Distal MCA branches perfused to their distal aspects without significant stenosis. Posterior circulation: Vertebral arteries patent to the vertebrobasilar junction without stenosis. Posterior inferior cerebral arteries patent bilaterally. Basilar patent to its distal aspect. Superior cerebellar arteries patent proximally. Patent P1 segments. PCAs perfused to their distal aspects without significant stenosis. The bilateral posterior communicating arteries are patent. Anatomic variants: None significant MRA NECK FINDINGS Evaluation is limited in the absence of intravenous contrast. Aortic arch: Poorly visualized. No evidence  of dissection or aneurysm. Right carotid system: Poor visualization of the proximal right common carotid artery. The mid to distal right common carotid is patent. No evidence of dissection, occlusion, or hemodynamically significant stenosis (greater than 50%) in the internal carotid artery. Left carotid system: Poor visualization of the proximal left common carotid artery. The mid to distal left common carotid is patent. No evidence of dissection, occlusion, or hemodynamically significant stenosis (greater than 50%) in the internal carotid artery. Vertebral arteries: Poor visualization of the vertebral artery origins and proximal V1 segments. The vertebral arteries are otherwise patent to the skull base, without significant stenosis or evidence of dissection. Other: None. IMPRESSION: 1. No acute intracranial process. No evidence of acute or subacute infarct. 2. No intracranial large vessel occlusion or significant stenosis. 3. No hemodynamically significant stenosis in the neck. Electronically Signed   By: Wiliam Ke M.D.   On: 06/02/2023 21:25   CT HEAD CODE STROKE WO CONTRAST Result Date: 06/02/2023 CLINICAL DATA:  Code stroke. New onset dizziness and right-sided tingling lasting 30 minutes. EXAM: CT HEAD WITHOUT CONTRAST TECHNIQUE: Contiguous axial images were obtained from the base of the skull through the vertex without intravenous contrast. RADIATION DOSE REDUCTION: This exam was performed according to the departmental dose-optimization program which includes automated exposure control, adjustment of the mA and/or kV according to patient size and/or use of iterative reconstruction technique. COMPARISON:  MR head without contrast scratched at MR head without and with contrast 04/21/2005 FINDINGS: Brain: No acute infarct,  hemorrhage, or mass lesion is present. No significant white matter lesions are present. Deep brain nuclei are within normal limits. The ventricles are of normal size. No significant  extraaxial fluid collection is present. Midline structures are within normal limits. The brainstem and cerebellum are within normal limits. Vascular: No hyperdense vessel or unexpected calcification. Skull: Calvarium is intact. No focal lytic or blastic lesions are present. No significant extracranial soft tissue lesion is present. Sinuses/Orbits: The paranasal sinuses and mastoid air cells are clear. The globes and orbits are within normal limits. ASPECTS [facility] Stroke Program Early CT Score) - Ganglionic level infarction (caudate, lentiform nuclei, internal capsule, insula, M1-M3 cortex): 7/7 - Supraganglionic infarction (M4-M6 cortex): 3/3 Total score (0-10 with 10 being normal): 10/10 IMPRESSION: 1. Normal head CT. 2. Aspects is 10/10. Electronically Signed   By: Marin Roberts M.D.   On: 06/02/2023 18:41    Administration History     None           No data to display          No results found for: "NITRICOXIDE"      Assessment & Plan:   No problem-specific Assessment & Plan notes found for this encounter.     Rubye Oaks, NP 06/26/2023

## 2023-06-27 DIAGNOSIS — G4733 Obstructive sleep apnea (adult) (pediatric): Secondary | ICD-10-CM | POA: Insufficient documentation

## 2023-06-27 NOTE — Assessment & Plan Note (Signed)
Moderate obstructive sleep apnea.  Patient education was given on sleep apnea.  Treatment options were reviewed in detail.  Patient will proceed with CPAP therapy begin auto CPAP 5 to 15 cm H2O.  - discussed how weight can impact sleep and risk for sleep disordered breathing - discussed options to assist with weight loss: combination of diet modification, cardiovascular and strength training exercises   - had an extensive discussion regarding the adverse health consequences related to untreated sleep disordered breathing - specifically discussed the risks for hypertension, coronary artery disease, cardiac dysrhythmias, cerebrovascular disease, and diabetes - lifestyle modification discussed   - discussed how sleep disruption can increase risk of accidents, particularly when driving - safe driving practices were discussed   Plan  Patient Instructions  Begin CPAP at bedtime, wear all night long for at least 6 or more hours Do not drive if sleepy Work on healthy weight loss Follow-up in 3 months and as needed '

## 2023-06-27 NOTE — Assessment & Plan Note (Signed)
Healthy weight loss discussed 

## 2023-09-27 ENCOUNTER — Telehealth: Payer: Self-pay

## 2023-09-27 NOTE — Telephone Encounter (Signed)
 Called pt to see if they have received it has not gotten it yet due to trying to save for the down payment will put in a recall for about 2 month and she plans to pay for it in about a month and will schedule from a month out that way it has been at least 30 days of usage to go over at visit.NFN

## 2023-09-28 ENCOUNTER — Telehealth: Payer: BC Managed Care – PPO | Admitting: Adult Health

## 2023-10-31 ENCOUNTER — Other Ambulatory Visit: Payer: Self-pay | Admitting: Internal Medicine

## 2023-10-31 DIAGNOSIS — Z1231 Encounter for screening mammogram for malignant neoplasm of breast: Secondary | ICD-10-CM

## 2023-11-06 ENCOUNTER — Ambulatory Visit
Admission: RE | Admit: 2023-11-06 | Discharge: 2023-11-06 | Disposition: A | Discharge status: Home / Self Care | Source: Ambulatory Visit | Attending: Internal Medicine | Admitting: Internal Medicine

## 2023-11-06 DIAGNOSIS — Z1231 Encounter for screening mammogram for malignant neoplasm of breast: Secondary | ICD-10-CM

## 2024-01-05 ENCOUNTER — Telehealth: Payer: Self-pay | Admitting: Emergency Medicine

## 2024-01-05 ENCOUNTER — Encounter: Payer: Self-pay | Admitting: Emergency Medicine

## 2024-01-05 ENCOUNTER — Ambulatory Visit
Admission: EM | Admit: 2024-01-05 | Discharge: 2024-01-05 | Disposition: A | Discharge status: Home / Self Care | Attending: Nurse Practitioner | Admitting: Nurse Practitioner

## 2024-01-05 DIAGNOSIS — Z8709 Personal history of other diseases of the respiratory system: Secondary | ICD-10-CM

## 2024-01-05 DIAGNOSIS — J101 Influenza due to other identified influenza virus with other respiratory manifestations: Secondary | ICD-10-CM | POA: Diagnosis not present

## 2024-01-05 LAB — POC COVID19/FLU A&B COMBO
Covid Antigen, POC: NEGATIVE
Influenza A Antigen, POC: POSITIVE — AB
Influenza B Antigen, POC: NEGATIVE

## 2024-01-05 MED ORDER — ALBUTEROL SULFATE HFA 108 (90 BASE) MCG/ACT IN AERS: 2.0000 | INHALATION_SPRAY | Freq: Four times a day (QID) | RESPIRATORY_TRACT | 0 refills | Status: AC | PRN

## 2024-01-05 MED ORDER — PROMETHAZINE-DM 6.25-15 MG/5ML PO SYRP: 5.0000 mL | ORAL_SOLUTION | Freq: Four times a day (QID) | ORAL | 0 refills | Status: DC | PRN

## 2024-01-05 MED ORDER — ALBUTEROL SULFATE (2.5 MG/3ML) 0.083% IN NEBU: 2.5000 mg | INHALATION_SOLUTION | Freq: Four times a day (QID) | RESPIRATORY_TRACT | 0 refills | Status: AC | PRN

## 2024-01-05 MED ORDER — PROMETHAZINE-DM 6.25-15 MG/5ML PO SYRP: 5.0000 mL | ORAL_SOLUTION | Freq: Four times a day (QID) | ORAL | 0 refills | Status: AC | PRN

## 2024-01-05 MED ORDER — ACETAMINOPHEN 325 MG PO TABS
975.0000 mg | ORAL_TABLET | Freq: Once | ORAL | Status: AC
  Administered 2024-01-05: 975 mg via ORAL

## 2024-01-05 MED ORDER — OSELTAMIVIR PHOSPHATE 75 MG PO CAPS: 75.0000 mg | ORAL_CAPSULE | Freq: Two times a day (BID) | ORAL | 0 refills | Status: AC

## 2024-01-05 MED ORDER — OSELTAMIVIR PHOSPHATE 75 MG PO CAPS: 75.0000 mg | ORAL_CAPSULE | Freq: Two times a day (BID) | ORAL | 0 refills | Status: DC

## 2024-01-05 MED ORDER — ALBUTEROL SULFATE (2.5 MG/3ML) 0.083% IN NEBU: 2.5000 mg | INHALATION_SOLUTION | Freq: Four times a day (QID) | RESPIRATORY_TRACT | 0 refills | Status: DC | PRN

## 2024-01-05 MED ORDER — ALBUTEROL SULFATE HFA 108 (90 BASE) MCG/ACT IN AERS: 2.0000 | INHALATION_SPRAY | Freq: Four times a day (QID) | RESPIRATORY_TRACT | 0 refills | Status: DC | PRN

## 2024-01-05 NOTE — Telephone Encounter (Signed)
 Prescriptions sent to [facility] in [place] instead of [place]

## 2024-01-05 NOTE — ED Triage Notes (Signed)
 Fever, chills, body aches, cough x 2 days.

## 2024-01-05 NOTE — ED Provider Notes (Signed)
 [facility]    CSN: [id number] Arrival date & time: 01/05/24  1237      History   Chief Complaint No chief complaint on file.   HPI Jessica [name] is a 53 y.o. female.   The history is provided by the patient.   Patient presents for complaints of fever, chills, body aches, headache, wheezing and cough.  Symptoms started approximately 2 days ago.  Patient denies ear pain, ear drainage, difficulty breathing, abdominal pain, nausea, vomiting, diarrhea, or rash.  Patient states she has been taking over-the-counter medication for her symptoms.  States that she did visit her sister in the hospital, denies any other obvious known close sick contacts.  Past Medical History:  Diagnosis Date   Asthma    Depression    Diabetes mellitus without complication (HCC)    Headache(784.0)    Hypothyroidism    Iron deficiency anemia    Renal disorder     Patient Active Problem List   Diagnosis Date Noted   OSA (obstructive sleep apnea) 06/27/2023   Morbid obesity (HCC) 06/27/2023   Snoring 04/05/2023   Migraine 04/03/2014   Intractable migraine without aura 09/04/2012    Past Surgical History:  Procedure Laterality Date   BIOPSY THYROID   2013   laroscopy     thyroid  biospy     TUBAL LIGATION  2005    OB History   No obstetric history on file.      Home Medications    Prior to Admission medications   Medication Sig Start Date End Date Taking? Authorizing Provider  albuterol  (PROVENTIL ) (2.5 MG/3ML) 0.083% nebulizer solution Take 3 mLs (2.5 mg total) by nebulization every 6 (six) hours as needed for wheezing or shortness of breath. 08/07/19   Cottie Donnice JINNY, MD  albuterol  (VENTOLIN  HFA) 108 (90 Base) MCG/ACT inhaler Inhale 1-2 puffs into the lungs every 6 (six) hours as needed for wheezing or shortness of breath. 08/07/19   Cottie Donnice JINNY, MD  meclizine  (ANTIVERT ) 25 MG tablet Take 1 tablet (25 mg total) by mouth 3 (three) times daily as needed for dizziness. 06/02/23    Yolande Lamar BROCKS, MD    Family History Family History  Problem Relation Age of Onset   Hypertension Mother    Migraines Mother    Sickle cell anemia Father    Breast cancer Neg Hx     Social History Social History   Tobacco Use   Smoking status: Never   Smokeless tobacco: Never  Substance Use Topics   Alcohol use: No   Drug use: No     Allergies   Bee venom, Iodinated contrast media, Hydrocodone, Imitrex [sumatriptan], Latex, Shellfish allergy, and Sulfa antibiotics   Review of Systems Review of Systems Per HPI  Physical Exam Triage Vital Signs ED Triage Vitals  Encounter Vitals Group     BP 01/05/24 1314 (!) 142/83     Girls Systolic BP Percentile --      Girls Diastolic BP Percentile --      Boys Systolic BP Percentile --      Boys Diastolic BP Percentile --      Pulse Rate 01/05/24 1314 (!) 101     Resp 01/05/24 1314 18     Temp 01/05/24 1314 (!) 101.2 F (38.4 C)     Temp Source 01/05/24 1314 Oral     SpO2 01/05/24 1314 93 %     Weight --      Height --  Head Circumference --      Peak Flow --      Pain Score 01/05/24 1315 9     Pain Loc --      Pain Education --      Exclude from Growth Chart --    No data found.  Updated Vital Signs BP (!) 142/83 (BP Location: Right Arm)   Pulse (!) 101   Temp (!) 101.2 F (38.4 C) (Oral)   Resp 18   SpO2 93%   Visual Acuity Right Eye Distance:   Left Eye Distance:   Bilateral Distance:    Right Eye Near:   Left Eye Near:    Bilateral Near:     Physical Exam Vitals and nursing note reviewed.  Constitutional:      General: She is not in acute distress.    Appearance: Normal appearance.  HENT:     Head: Normocephalic.     Right Ear: Tympanic membrane, ear canal and external ear normal.     Left Ear: Tympanic membrane, ear canal and external ear normal.     Nose: Nose normal.     Right Turbinates: Enlarged and swollen.     Left Turbinates: Enlarged and swollen.     Right Sinus: No  maxillary sinus tenderness or frontal sinus tenderness.     Left Sinus: No maxillary sinus tenderness or frontal sinus tenderness.     Mouth/Throat:     Lips: Pink.     Mouth: Mucous membranes are moist.     Pharynx: Postnasal drip present. No pharyngeal swelling, oropharyngeal exudate, posterior oropharyngeal erythema or uvula swelling.     Comments: Cobblestoning present to posterior oropharynx  Eyes:     Extraocular Movements: Extraocular movements intact.     Conjunctiva/sclera: Conjunctivae normal.     Pupils: Pupils are equal, round, and reactive to light.  Cardiovascular:     Rate and Rhythm: Normal rate and regular rhythm.     Pulses: Normal pulses.     Heart sounds: Normal heart sounds.  Pulmonary:     Effort: Pulmonary effort is normal. No respiratory distress.     Breath sounds: Normal breath sounds. No stridor. No wheezing, rhonchi or rales.  Abdominal:     General: Bowel sounds are normal.     Palpations: Abdomen is soft.     Tenderness: There is no abdominal tenderness.  Musculoskeletal:     Cervical back: Normal range of motion.  Skin:    General: Skin is warm and dry.  Neurological:     General: No focal deficit present.     Mental Status: She is alert and oriented to person, place, and time.  Psychiatric:        Mood and Affect: Mood normal.        Behavior: Behavior normal.      UC Treatments / Results  Labs (all labs ordered are listed, but only abnormal results are displayed) Labs Reviewed  POC COVID19/FLU A&B COMBO - Abnormal; Notable for the following components:      Result Value   Influenza A Antigen, POC Positive (*)    All other components within normal limits    EKG   Radiology No results found.  Procedures Procedures (including critical care time)  Medications Ordered in UC Medications  acetaminophen  (TYLENOL ) tablet 975 mg (975 mg Oral Given 01/05/24 1318)    Initial Impression / Assessment and Plan / UC Course  I have reviewed  the triage vital signs and the nursing  notes.  Pertinent labs & imaging results that were available during my care of the patient were reviewed by me and considered in my medical decision making (see chart for details).  COVID/flu test was positive for influenza A.  Patient's symptoms started 48 hours ago, she is in the window to receive Tamiflu  75 mg.  Will also provide symptomatic treatment with Promethazine  DM for the cough, and refill of her albuterol  inhaler and albuterol  nebulizer solution.  Patient was administered Tylenol  975 mg as she was febrile during her appointment today.  Supportive care recommendations were provided discussed with the patient to include fluids, rest, continuing over-the-counter analgesics, and use of a humidifier at nighttime during sleep and sleeping elevated while symptoms persist.  Discussed indications with patient regarding follow-up.  Patient was in agreement with this plan of care and verbalizes understanding.  All questions were answered.  Patient stable for discharge.  Work note was provided.   Final Clinical Impressions(s) / UC Diagnoses   Final diagnoses:  None   Discharge Instructions   None    ED Prescriptions   None    PDMP not reviewed this encounter.   Gilmer Etta PARAS, NP 01/05/24 1349

## 2024-01-05 NOTE — Discharge Instructions (Signed)
 The COVID/flu test was positive for influenza A. Take medication as prescribed. Increase fluids and allow for plenty of rest. Continue Tylenol  or ibuprofen for pain, fever, or general discomfort.  You were given Tylenol  975 mg at 1:18 PM today. Recommend use of a humidifier in your bedroom at nighttime during sleep and sleeping elevated on pillows while cough symptoms persist. You should remain home until you have been fever free for 24 hours with no medication. Symptoms should begin to improve over the next 5 to 7 days.  If symptoms fail to improve, or appear to worsen, you may follow-up in this clinic or with your primary care physician for further evaluation. Follow-up as needed.

## 2024-06-09 ENCOUNTER — Emergency Department (HOSPITAL_COMMUNITY)

## 2024-06-09 ENCOUNTER — Emergency Department (HOSPITAL_COMMUNITY)
Admission: EM | Admit: 2024-06-09 | Discharge: 2024-06-09 | Disposition: A | Discharge status: Home / Self Care | Attending: Emergency Medicine | Admitting: Emergency Medicine

## 2024-06-09 ENCOUNTER — Encounter (HOSPITAL_COMMUNITY): Payer: Self-pay | Admitting: Emergency Medicine

## 2024-06-09 ENCOUNTER — Other Ambulatory Visit: Payer: Self-pay

## 2024-06-09 DIAGNOSIS — M545 Low back pain, unspecified: Secondary | ICD-10-CM | POA: Diagnosis present

## 2024-06-09 DIAGNOSIS — G8929 Other chronic pain: Secondary | ICD-10-CM | POA: Insufficient documentation

## 2024-06-09 DIAGNOSIS — M5441 Lumbago with sciatica, right side: Secondary | ICD-10-CM | POA: Diagnosis not present

## 2024-06-09 LAB — URINALYSIS, ROUTINE W REFLEX MICROSCOPIC
Bilirubin Urine: NEGATIVE
Glucose, UA: NEGATIVE mg/dL
Hgb urine dipstick: NEGATIVE
Ketones, ur: NEGATIVE mg/dL
Leukocytes,Ua: NEGATIVE
Nitrite: NEGATIVE
Protein, ur: NEGATIVE mg/dL
Specific Gravity, Urine: 1.012 (ref 1.005–1.030)
pH: 5 (ref 5.0–8.0)

## 2024-06-09 LAB — CBC WITH DIFFERENTIAL/PLATELET
Abs Immature Granulocytes: 0.01 K/uL (ref 0.00–0.07)
Basophils Absolute: 0 K/uL (ref 0.0–0.1)
Basophils Relative: 1 %
Eosinophils Absolute: 0.2 K/uL (ref 0.0–0.5)
Eosinophils Relative: 5 %
HCT: 38.3 % (ref 36.0–46.0)
Hemoglobin: 12.8 g/dL (ref 12.0–15.0)
Immature Granulocytes: 0 %
Lymphocytes Relative: 44 %
Lymphs Abs: 2.4 K/uL (ref 0.7–4.0)
MCH: 29 pg (ref 26.0–34.0)
MCHC: 33.4 g/dL (ref 30.0–36.0)
MCV: 86.8 fL (ref 80.0–100.0)
Monocytes Absolute: 0.3 K/uL (ref 0.1–1.0)
Monocytes Relative: 6 %
Neutro Abs: 2.3 K/uL (ref 1.7–7.7)
Neutrophils Relative %: 44 %
Platelets: 228 K/uL (ref 150–400)
RBC: 4.41 MIL/uL (ref 3.87–5.11)
RDW: 13.6 % (ref 11.5–15.5)
WBC: 5.3 K/uL (ref 4.0–10.5)
nRBC: 0 % (ref 0.0–0.2)

## 2024-06-09 LAB — COMPREHENSIVE METABOLIC PANEL WITH GFR
ALT: 12 U/L (ref 0–44)
AST: 20 U/L (ref 15–41)
Albumin: 4.1 g/dL (ref 3.5–5.0)
Alkaline Phosphatase: 75 U/L (ref 38–126)
Anion gap: 7 (ref 5–15)
BUN: 12 mg/dL (ref 6–20)
CO2: 28 mmol/L (ref 22–32)
Calcium: 8.9 mg/dL (ref 8.9–10.3)
Chloride: 105 mmol/L (ref 98–111)
Creatinine, Ser: 1.11 mg/dL — ABNORMAL HIGH (ref 0.44–1.00)
GFR, Estimated: 60 mL/min — ABNORMAL LOW
Glucose, Bld: 97 mg/dL (ref 70–99)
Potassium: 4.1 mmol/L (ref 3.5–5.1)
Sodium: 139 mmol/L (ref 135–145)
Total Bilirubin: 0.5 mg/dL (ref 0.0–1.2)
Total Protein: 6.8 g/dL (ref 6.5–8.1)

## 2024-06-09 MED ORDER — OXYCODONE-ACETAMINOPHEN 5-325 MG PO TABS: ORAL_TABLET | ORAL | 0 refills | Status: AC

## 2024-06-09 MED ORDER — KETOROLAC TROMETHAMINE 30 MG/ML IJ SOLN
30.0000 mg | Freq: Once | INTRAMUSCULAR | Status: AC
  Administered 2024-06-09: 30 mg via INTRAVENOUS
  Filled 2024-06-09: qty 1

## 2024-06-09 MED ORDER — ONDANSETRON HCL 4 MG/2ML IJ SOLN
4.0000 mg | Freq: Once | INTRAMUSCULAR | Status: AC
  Administered 2024-06-09: 4 mg via INTRAVENOUS
  Filled 2024-06-09: qty 2

## 2024-06-09 NOTE — ED Notes (Addendum)
 SABRA

## 2024-06-09 NOTE — ED Triage Notes (Signed)
 Pt states she has right sided back/flank pain that radiates to the left side of her back. C/o tingling going down both legs and urinary frequency pta.

## 2024-06-09 NOTE — Discharge Instructions (Signed)
 Follow-up with your family doctor next week for recheck.  Do not do any heavy lifting

## 2024-06-09 NOTE — ED Provider Notes (Signed)
 " [facility] Provider Note   CSN: [id number] Arrival date & time: 06/09/24  9296     Patient presents with: Back Pain   Jessica [name] is a 54 y.o. female.   Patient complains of lower back pain.  Worse with movement.  The history is provided by the patient and medical records.  Back Pain Location:  Thoracic spine Quality:  Aching Radiates to:  Does not radiate Pain severity:  Moderate Pain is:  Same all the time Onset quality:  Sudden Timing:  Constant Progression:  Worsening Chronicity:  New Context: not emotional stress   Associated symptoms: no abdominal pain, no chest pain and no headaches        Prior to Admission medications  Medication Sig Start Date End Date Taking? Authorizing Provider  oxyCODONE -acetaminophen  (PERCOCET/ROXICET) 5-325 MG tablet Take 1 every 6 hours for pain is not relieved by Tylenol  or Motrin alone. 06/09/24  Yes Suzette Pac, MD  albuterol  (PROVENTIL ) (2.5 MG/3ML) 0.083% nebulizer solution Take 3 mLs (2.5 mg total) by nebulization every 6 (six) hours as needed for wheezing or shortness of breath. 01/05/24   Leath-Warren, Etta JINNY, NP  albuterol  (VENTOLIN  HFA) 108 (90 Base) MCG/ACT inhaler Inhale 2 puffs into the lungs every 6 (six) hours as needed. 01/05/24   Leath-Warren, Etta JINNY, NP  meclizine  (ANTIVERT ) 25 MG tablet Take 1 tablet (25 mg total) by mouth 3 (three) times daily as needed for dizziness. 06/02/23   Yolande Lamar BROCKS, MD  oseltamivir  (TAMIFLU ) 75 MG capsule Take 1 capsule (75 mg total) by mouth every 12 (twelve) hours. 01/05/24   Leath-Warren, Etta JINNY, NP  promethazine -dextromethorphan (PROMETHAZINE -DM) 6.25-15 MG/5ML syrup Take 5 mLs by mouth 4 (four) times daily as needed. 01/05/24   Leath-Warren, Etta JINNY, NP    Allergies: Bee venom, Iodinated contrast media, Hydrocodone, Imitrex [sumatriptan], Latex, Shellfish allergy, and Sulfa antibiotics    Review of Systems  Constitutional:   Negative for appetite change and fatigue.  HENT:  Negative for congestion, ear discharge and sinus pressure.   Eyes:  Negative for discharge.  Respiratory:  Negative for cough.   Cardiovascular:  Negative for chest pain.  Gastrointestinal:  Negative for abdominal pain and diarrhea.  Genitourinary:  Negative for frequency and hematuria.  Musculoskeletal:  Positive for back pain.  Skin:  Negative for rash.  Neurological:  Negative for seizures and headaches.  Psychiatric/Behavioral:  Negative for hallucinations.     Updated Vital Signs BP 130/72 (BP Location: Left Arm)   Pulse 78   Temp (!) 97.3 F (36.3 C)   Resp 18   Ht 5' 7 (1.702 m)   Wt 108.4 kg   SpO2 98%   BMI 37.43 kg/m   Physical Exam Vitals and nursing note reviewed.  Constitutional:      Appearance: She is well-developed.  HENT:     Head: Normocephalic.     Nose: Nose normal.  Eyes:     General: No scleral icterus.    Conjunctiva/sclera: Conjunctivae normal.  Neck:     Thyroid : No thyromegaly.  Cardiovascular:     Rate and Rhythm: Normal rate and regular rhythm.     Heart sounds: No murmur heard.    No friction rub. No gallop.  Pulmonary:     Breath sounds: No stridor. No wheezing or rales.  Chest:     Chest wall: No tenderness.  Abdominal:     General: There is no distension.  Tenderness: There is no abdominal tenderness. There is no rebound.  Musculoskeletal:        General: Normal range of motion.     Cervical back: Neck supple.     Comments: Tender lumbar muscles on the right  Lymphadenopathy:     Cervical: No cervical adenopathy.  Skin:    Findings: No erythema or rash.  Neurological:     Mental Status: She is alert and oriented to person, place, and time.     Motor: No abnormal muscle tone.     Coordination: Coordination normal.  Psychiatric:        Behavior: Behavior normal.     (all labs ordered are listed, but only abnormal results are displayed) Labs Reviewed  COMPREHENSIVE  METABOLIC PANEL WITH GFR - Abnormal; Notable for the following components:      Result Value   Creatinine, Ser 1.11 (*)    GFR, Estimated 60 (*)    All other components within normal limits  CBC WITH DIFFERENTIAL/PLATELET  URINALYSIS, ROUTINE W REFLEX MICROSCOPIC    EKG: None  Radiology: CT Renal Stone Study Result Date: 06/09/2024 CLINICAL DATA:  Abdominal and flank pain for 2 days. Kidney stones suspected. EXAM: CT ABDOMEN AND PELVIS WITHOUT CONTRAST TECHNIQUE: Multidetector CT imaging of the abdomen and pelvis was performed following the standard protocol without IV contrast. RADIATION DOSE REDUCTION: This exam was performed according to the departmental dose-optimization program which includes automated exposure control, adjustment of the mA and/or kV according to patient size and/or use of iterative reconstruction technique. COMPARISON:  None Available. FINDINGS: Lower chest: No acute findings. Hepatobiliary: 2 cm water density lesion inferior right liver is compatible with a cyst. A tiny hypodensity in the liver parenchyma is too small to characterize but is statistically most likely benign. No followup imaging is recommended. There is no evidence for gallstones, gallbladder wall thickening, or pericholecystic fluid. No intrahepatic or extrahepatic biliary dilation. Pancreas: No focal mass lesion. No dilatation of the main duct. No intraparenchymal cyst. No peripancreatic edema. Spleen: No splenomegaly. No suspicious focal mass lesion. Adrenals/Urinary Tract: No adrenal nodule or mass. Kidneys unremarkable. No evidence for hydroureter. The urinary bladder appears normal for the degree of distention. Stomach/Bowel: Stomach is unremarkable. No gastric wall thickening. No evidence of outlet obstruction. Duodenum is normally positioned as is the ligament of Treitz. No small bowel wall thickening. No small bowel dilatation. The terminal ileum is normal. The appendix is normal. No gross colonic mass. No  colonic wall thickening. Vascular/Lymphatic: No abdominal aortic aneurysm. No abdominal aortic atherosclerotic calcification. There is no gastrohepatic or hepatoduodenal ligament lymphadenopathy. No retroperitoneal or mesenteric lymphadenopathy. No pelvic sidewall lymphadenopathy. Reproductive: Unremarkable. Other: Trace free fluid in the cul-de-sac. Musculoskeletal: No worrisome lytic or sclerotic osseous abnormality. IMPRESSION: 1. No acute findings in the abdomen or pelvis. Specifically, no findings to explain the patient's history of flank pain. 2. Trace free fluid in the cul-de-sac, potentially physiologic. Electronically Signed   By: Camellia Candle M.D.   On: 06/09/2024 08:27     Procedures   Medications Ordered in the ED  ketorolac  (TORADOL ) 30 MG/ML injection 30 mg (30 mg Intravenous Given 06/09/24 0806)  ondansetron  (ZOFRAN ) injection 4 mg (4 mg Intravenous Given 06/09/24 0806)                                    Medical Decision Making Amount and/or Complexity of Data Reviewed Labs: ordered.  Radiology: ordered.  Risk Prescription drug management.  Lumbar strain.  Patient placed on Percocets and will take Tylenol  and Motrin and follow-up with PCP     Final diagnoses:  Chronic bilateral low back pain with right-sided sciatica    ED Discharge Orders          Ordered    oxyCODONE -acetaminophen  (PERCOCET/ROXICET) 5-325 MG tablet        06/09/24 1050               Suzette Pac, MD 06/10/24 1711  "
